# Patient Record
Sex: Female | Born: 1940 | Race: Black or African American | Hispanic: No | Smoking: Former smoker
Health system: Southern US, Community
[De-identification: ages and names within clinical notes are randomized; demographics above are authoritative.]

## PROBLEM LIST (undated history)

## (undated) ENCOUNTER — Ambulatory Visit (HOSPITAL_COMMUNITY): Admission: EM | Payer: 59 | Source: Home / Self Care

## (undated) DIAGNOSIS — N3281 Overactive bladder: Secondary | ICD-10-CM

## (undated) DIAGNOSIS — I10 Essential (primary) hypertension: Secondary | ICD-10-CM

## (undated) DIAGNOSIS — F419 Anxiety disorder, unspecified: Secondary | ICD-10-CM

## (undated) DIAGNOSIS — M8580 Other specified disorders of bone density and structure, unspecified site: Secondary | ICD-10-CM

## (undated) DIAGNOSIS — K219 Gastro-esophageal reflux disease without esophagitis: Principal | ICD-10-CM

## (undated) DIAGNOSIS — T7840XA Allergy, unspecified, initial encounter: Secondary | ICD-10-CM

## (undated) DIAGNOSIS — G14 Postpolio syndrome: Secondary | ICD-10-CM

## (undated) DIAGNOSIS — D509 Iron deficiency anemia, unspecified: Secondary | ICD-10-CM

## (undated) DIAGNOSIS — E785 Hyperlipidemia, unspecified: Secondary | ICD-10-CM

## (undated) HISTORY — DX: Overactive bladder: N32.81

## (undated) HISTORY — DX: Hyperlipidemia, unspecified: E78.5

## (undated) HISTORY — PX: ABDOMINAL HYSTERECTOMY: SHX81

## (undated) HISTORY — DX: Other specified disorders of bone density and structure, unspecified site: M85.80

## (undated) HISTORY — DX: Allergy, unspecified, initial encounter: T78.40XA

## (undated) HISTORY — DX: Anxiety disorder, unspecified: F41.9

## (undated) HISTORY — DX: Postpolio syndrome: G14

## (undated) HISTORY — PX: CARPAL TUNNEL RELEASE: SHX101

## (undated) HISTORY — DX: Iron deficiency anemia, unspecified: D50.9

## (undated) HISTORY — DX: Essential (primary) hypertension: I10

## (undated) HISTORY — DX: Gastro-esophageal reflux disease without esophagitis: K21.9

---

## 1998-10-22 ENCOUNTER — Ambulatory Visit (HOSPITAL_COMMUNITY): Admission: RE | Admit: 1998-10-22 | Discharge: 1998-10-22 | Payer: Self-pay

## 1999-02-05 ENCOUNTER — Other Ambulatory Visit: Admission: RE | Admit: 1999-02-05 | Discharge: 1999-02-05 | Payer: Self-pay | Admitting: *Deleted

## 1999-11-12 ENCOUNTER — Ambulatory Visit (HOSPITAL_COMMUNITY): Admission: RE | Admit: 1999-11-12 | Discharge: 1999-11-12 | Payer: Self-pay | Admitting: *Deleted

## 1999-11-20 ENCOUNTER — Encounter: Admission: RE | Admit: 1999-11-20 | Discharge: 1999-11-20 | Payer: Self-pay | Admitting: *Deleted

## 1999-11-22 ENCOUNTER — Encounter: Admission: RE | Admit: 1999-11-22 | Discharge: 1999-11-22 | Payer: Self-pay | Admitting: *Deleted

## 1999-12-26 ENCOUNTER — Encounter: Admission: RE | Admit: 1999-12-26 | Discharge: 1999-12-26 | Payer: Self-pay | Admitting: *Deleted

## 2000-02-25 ENCOUNTER — Other Ambulatory Visit: Admission: RE | Admit: 2000-02-25 | Discharge: 2000-02-25 | Payer: Self-pay | Admitting: *Deleted

## 2000-03-09 ENCOUNTER — Inpatient Hospital Stay (HOSPITAL_COMMUNITY): Admission: RE | Admit: 2000-03-09 | Discharge: 2000-03-11 | Payer: Self-pay | Admitting: *Deleted

## 2000-03-09 ENCOUNTER — Encounter (INDEPENDENT_AMBULATORY_CARE_PROVIDER_SITE_OTHER): Payer: Self-pay

## 2000-03-09 ENCOUNTER — Encounter (INDEPENDENT_AMBULATORY_CARE_PROVIDER_SITE_OTHER): Payer: Self-pay | Admitting: Specialist

## 2000-03-09 HISTORY — PX: TOTAL ABDOMINAL HYSTERECTOMY W/ BILATERAL SALPINGOOPHORECTOMY: SHX83

## 2000-12-02 ENCOUNTER — Ambulatory Visit (HOSPITAL_BASED_OUTPATIENT_CLINIC_OR_DEPARTMENT_OTHER): Admission: RE | Admit: 2000-12-02 | Discharge: 2000-12-02 | Payer: Self-pay | Admitting: *Deleted

## 2001-02-03 ENCOUNTER — Ambulatory Visit (HOSPITAL_COMMUNITY): Admission: RE | Admit: 2001-02-03 | Discharge: 2001-02-03 | Payer: Self-pay | Admitting: Internal Medicine

## 2001-02-03 ENCOUNTER — Encounter: Payer: Self-pay | Admitting: Internal Medicine

## 2001-03-18 ENCOUNTER — Other Ambulatory Visit: Admission: RE | Admit: 2001-03-18 | Discharge: 2001-03-18 | Payer: Self-pay | Admitting: Obstetrics and Gynecology

## 2002-09-09 ENCOUNTER — Other Ambulatory Visit: Admission: RE | Admit: 2002-09-09 | Discharge: 2002-09-09 | Payer: Self-pay | Admitting: Internal Medicine

## 2003-06-06 ENCOUNTER — Other Ambulatory Visit: Admission: RE | Admit: 2003-06-06 | Discharge: 2003-06-06 | Payer: Self-pay | Admitting: Obstetrics and Gynecology

## 2003-09-05 ENCOUNTER — Emergency Department (HOSPITAL_COMMUNITY): Admission: EM | Admit: 2003-09-05 | Discharge: 2003-09-05 | Payer: Self-pay

## 2008-01-31 ENCOUNTER — Ambulatory Visit: Payer: Self-pay | Admitting: Gastroenterology

## 2008-01-31 DIAGNOSIS — K625 Hemorrhage of anus and rectum: Secondary | ICD-10-CM | POA: Insufficient documentation

## 2008-02-03 ENCOUNTER — Ambulatory Visit: Payer: Self-pay | Admitting: Gastroenterology

## 2008-03-10 ENCOUNTER — Ambulatory Visit: Payer: Self-pay | Admitting: Gastroenterology

## 2008-06-03 ENCOUNTER — Emergency Department (HOSPITAL_COMMUNITY): Admission: EM | Admit: 2008-06-03 | Discharge: 2008-06-03 | Payer: Self-pay | Admitting: Family Medicine

## 2009-01-25 ENCOUNTER — Encounter: Admission: RE | Admit: 2009-01-25 | Discharge: 2009-01-25 | Payer: Self-pay | Admitting: Internal Medicine

## 2009-06-20 ENCOUNTER — Emergency Department (HOSPITAL_COMMUNITY): Admission: EM | Admit: 2009-06-20 | Discharge: 2009-06-20 | Payer: Self-pay | Admitting: Orthopaedic Surgery

## 2009-10-13 ENCOUNTER — Emergency Department (HOSPITAL_COMMUNITY): Admission: EM | Admit: 2009-10-13 | Discharge: 2009-10-13 | Payer: Self-pay | Admitting: Family Medicine

## 2011-05-13 NOTE — Assessment & Plan Note (Signed)
[facility]                         GASTROENTEROLOGY OFFICE NOTE   NAME:Barker, Katie EMANUELE                   MRN:          [id number]  DATE:01/31/2008                            DOB:          11-27-1941    REASON FOR CONSULTATION:  Rectal bleeding.   Katie Barker is a pleasant 70 year old white female referred through  the courtesy of Dr. [name] for evaluation.  For the last 2 years  she has noted intermittent rectal bleeding consisting of bright red  blood in the water or on the toilet tissue.  She has noticed some  protrusion of what sounds like hemorrhoids with her bowel movements.  She denies rectal or abdominal pain.  There has been no change in bowel  habits.   PAST MEDICAL HISTORY:  Pertinent for hypertension and polio.  She does  self-catheterize for her polio.   FAMILY HISTORY:  Noncontributory.   MEDICATIONS:  Include Benicar, Nexium, __________ , Lipitor, melatonin  and baby aspirin.   ALLERGIES:  SHE IS ALLERGIC TO PENICILLIN.   She neither smokes nor drinks.  She is married.  She is retired.   REVIEW OF SYSTEMS:  Pertinent for joint and back pain.   PHYSICAL EXAMINATION:  Pulse 84, blood pressure 146/86, weight 158.  HEENT:  EOMI.  PERRLA.  Sclerae are anicteric.  Conjunctivae are pink.  NECK:  Supple without thyromegaly, adenopathy or carotid bruits.  CHEST:  Clear to auscultation and percussion without adventitious  sounds.  CARDIAC:  Regular rhythm; normal S1 S2.  There are no murmurs, gallops  or rubs.  ABDOMEN:  Bowel sounds are normoactive.  Abdomen is soft, nontender and  nondistended.  There are no abdominal masses, tenderness, splenic  enlargement or hepatomegaly.  EXTREMITIES:  Full range of motion.  No cyanosis, clubbing or edema.  RECTAL:  Deferred.  (Limited rectal bleeding--most likely secondary to  hemorrhoids.  A more proximal colonic bleeding source out to be ruled  out.)   RECOMMENDATIONS:   Colonoscopy.     Katie Barker. Arlyce Dice, MD,FACG  Electronically Signed   RDK/MedQ  DD: 01/31/2008  DT: 02/01/2008  Job #: [id number]   cc:   Georgianne Fick, M.D.

## 2011-05-13 NOTE — Letter (Signed)
January 31, 2008    Joni Reining   RE:  DANYA, SPEARMAN  MRN:  [id number]  /  DOB:  09/15/1941   Dear Cordelia Pen:   It is my pleasure to have treated you recently as a new patient in my  office.  I appreciate your confidence and the opportunity to participate  in your care.   Since I do have a busy inpatient endoscopy schedule and office schedule,  my office hours vary weekly.  I am, however, available for emergency  calls every day through my office.  If I cannot promptly meet an urgent  office appointment, another one of our gastroenterologists will be able  to assist you.   My well-trained staff are prepared to help you at all times.  For  emergencies after office hours, a physician from our gastroenterology  section is always available through my 24-hour answering service.   While you are under my care, I encourage discussion of your questions  and concerns, and I will be happy to return your calls as soon as I am  available.   Once again, I welcome you as a new patient and I look forward to a happy  and healthy relationship.    Sincerely,      Barbette Hair. Arlyce Dice, MD,FACG  Electronically Signed   RDK/MedQ  DD: 01/31/2008  DT: 02/01/2008  Job #: [id number]

## 2011-05-13 NOTE — Assessment & Plan Note (Signed)
[facility]                         GASTROENTEROLOGY OFFICE NOTE   NAME:Boyar, RAYSHELL GOECKE                   MRN:          [id number]  DATE:03/10/2008                            DOB:          [birth year]    PROBLEM:  Rectal bleeding.   Mrs. Sedam has returned following colonoscopy.  On February 03, 2008,  colonoscopy demonstrated internal hemorrhoids only.  She was given  Anusol suppositories with improvement in her bleeding.  At this point,  she may have a scant amount of blood on the toilet tissue only.  She is  without pain.   EXAM:  Pulse 88, blood pressure 132/86, weight 159.   IMPRESSION:  Very limited rectal bleeding, due to internal hemorrhoids.   RECOMMENDATIONS:  Continue symptomatic therapy only.     Barbette Hair. Arlyce Dice, MD,FACG  Electronically Signed    RDK/MedQ  DD: 03/10/2008  DT: 03/10/2008  Job #: [id number]   cc:   Georgianne Fick, M.D.

## 2011-05-13 NOTE — Letter (Signed)
January 31, 2008    Georgianne Fick, M.D.  [address]  [place], [place] [address]   RE:  Katie, Barker  MRN:  [id number]  /  DOB:  Nov 29, 1941   Dear [name]:   It is my pleasure to have treated you recently as a new patient in my  office.  I appreciate your confidence and the opportunity to participate  in your care.   Since I do have a busy inpatient endoscopy schedule and office schedule,  my office hours vary weekly.  I am, however, available for emergency  calls every day through my office.  If I cannot promptly meet an urgent  office appointment, another one of our gastroenterologists will be able  to assist you.   My well-trained staff are prepared to help you at all times.  For  emergencies after office hours, a physician from our gastroenterology  section is always available through my 24-hour answering service.   While you are under my care, I encourage discussion of your questions  and concerns, and I will be happy to return your calls as soon as I am  available.   Once again, I welcome you as a new patient and I look forward to a happy  and healthy relationship.    Sincerely,      Barbette Hair. Arlyce Dice, MD,FACG  Electronically Signed    RDK/MedQ  DD: 01/31/2008  DT: 02/01/2008  Job #: [id number]

## 2011-05-16 NOTE — Op Note (Signed)
[place]  Patient:    Katie Barker, Katie Barker                   MRN: [id number] Proc. Date: 12/03/00 Adm. Date:  [id number] Attending:  Kendell Bane                           Operative Report  PREOPERATIVE DIAGNOSIS:  Foreign body, right thumb.  POSTOPERATIVE DIAGNOSIS:  Foreign body, right thumb.  OPERATION:  Removal of foreign body, right thumb.  SURGEON:  Lowell Bouton, M.D.  ANESTHESIA:  0.5% Marcaine mixed with 2% lidocaine local.  OPERATIVE FINDINGS:  The patient had a small fishbone in the subcutaneous tissue of the pulp of her right thumb.  DESCRIPTION OF PROCEDURE:  Under local anesthesia with a digital block in the right thumb, the right hand was prepped and draped as a ______ room case.  A Penrose was used as a tourniquet at the base of the thumb, and the scab on the tip of the thumb was removed.  By gently probing in subcutaneous tissue, the foreign body was removed.  No sutures were inserted.  A Band-Aid was applied. The patient was discharged in good condition. DD:  12/03/00 TD:  12/03/00 Job: [id number]

## 2012-09-07 ENCOUNTER — Encounter: Payer: Self-pay | Admitting: Internal Medicine

## 2012-09-07 ENCOUNTER — Ambulatory Visit (INDEPENDENT_AMBULATORY_CARE_PROVIDER_SITE_OTHER): Payer: PRIVATE HEALTH INSURANCE | Admitting: Internal Medicine

## 2012-09-07 VITALS — BP 148/81 | HR 98 | Temp 97.5°F | Ht 63.0 in | Wt 154.4 lb

## 2012-09-07 DIAGNOSIS — I10 Essential (primary) hypertension: Secondary | ICD-10-CM

## 2012-09-07 DIAGNOSIS — K219 Gastro-esophageal reflux disease without esophagitis: Secondary | ICD-10-CM

## 2012-09-07 DIAGNOSIS — F411 Generalized anxiety disorder: Secondary | ICD-10-CM

## 2012-09-07 DIAGNOSIS — B91 Sequelae of poliomyelitis: Secondary | ICD-10-CM

## 2012-09-07 DIAGNOSIS — K5900 Constipation, unspecified: Secondary | ICD-10-CM | POA: Insufficient documentation

## 2012-09-07 DIAGNOSIS — N3281 Overactive bladder: Secondary | ICD-10-CM

## 2012-09-07 DIAGNOSIS — N318 Other neuromuscular dysfunction of bladder: Secondary | ICD-10-CM

## 2012-09-07 DIAGNOSIS — E785 Hyperlipidemia, unspecified: Secondary | ICD-10-CM

## 2012-09-07 DIAGNOSIS — Z0000 Encounter for general adult medical examination without abnormal findings: Secondary | ICD-10-CM

## 2012-09-07 DIAGNOSIS — F419 Anxiety disorder, unspecified: Secondary | ICD-10-CM

## 2012-09-07 DIAGNOSIS — G14 Postpolio syndrome: Secondary | ICD-10-CM

## 2012-09-07 DIAGNOSIS — D509 Iron deficiency anemia, unspecified: Secondary | ICD-10-CM

## 2012-09-07 HISTORY — DX: Essential (primary) hypertension: I10

## 2012-09-07 HISTORY — DX: Overactive bladder: N32.81

## 2012-09-07 HISTORY — DX: Anxiety disorder, unspecified: F41.9

## 2012-09-07 HISTORY — DX: Gastro-esophageal reflux disease without esophagitis: K21.9

## 2012-09-07 HISTORY — DX: Hyperlipidemia, unspecified: E78.5

## 2012-09-07 HISTORY — DX: Iron deficiency anemia, unspecified: D50.9

## 2012-09-07 HISTORY — DX: Postpolio syndrome: G14

## 2012-09-07 MED ORDER — FERROUS SULFATE 325 (65 FE) MG PO TABS: 325.0000 mg | ORAL_TABLET | Freq: Every day | ORAL | Status: DC

## 2012-09-07 MED ORDER — DIAZEPAM 2 MG PO TABS: 2.0000 mg | ORAL_TABLET | ORAL | Status: DC | PRN

## 2012-09-07 MED ORDER — ESOMEPRAZOLE MAGNESIUM 40 MG PO CPDR: 40.0000 mg | DELAYED_RELEASE_CAPSULE | Freq: Every day | ORAL | Status: DC

## 2012-09-07 NOTE — Progress Notes (Signed)
  Subjective:    Patient ID: Katie Barker, female    DOB: [birth year], 71 y.o.   MRN: [id number]  HPI  Please see the A&P for the status of the pt's chronic medical problems.  This is my first visit with Katie Barker. She is transferring care from Dr [name]. Apparently she was dismissed for three no shows and a few resch appts but that is only inference from his records.  Review of Systems  Constitutional: Negative for activity change, appetite change and unexpected weight change.  HENT: Negative for sore throat, rhinorrhea and postnasal drip.   Eyes: Negative for pain and itching.  Respiratory: Negative for cough and shortness of breath.   Cardiovascular: Negative for chest pain and leg swelling.  Gastrointestinal: Positive for abdominal pain and constipation. Negative for nausea, vomiting and diarrhea.  Musculoskeletal: Positive for arthralgias and gait problem.       Muscular weakness. Instability on uneven surfaces. Unable to take stairs / steps.  Skin: Negative for rash.  Neurological: Positive for headaches. Negative for dizziness and light-headedness.  Psychiatric/Behavioral: Positive for disturbed wake/sleep cycle. The patient is nervous/anxious.        Objective:   Physical Exam  Constitutional: She is oriented to person, place, and time. She appears well-developed and well-nourished. No distress.  HENT:  Head: Normocephalic and atraumatic.  Right Ear: External ear normal.  Left Ear: External ear normal.  Nose: Nose normal.  Eyes: Conjunctivae and EOM are normal.  Neck: Normal range of motion. Neck supple. No thyromegaly present.  Cardiovascular: Normal rate, regular rhythm and normal heart sounds.   No murmur heard. Pulmonary/Chest: Effort normal and breath sounds normal.  Abdominal: Soft. Bowel sounds are normal. There is no tenderness.  Musculoskeletal: She exhibits no edema and no tenderness.  Lymphadenopathy:    She has no cervical adenopathy.    Neurological: She is alert and oriented to person, place, and time.  Skin: Skin is warm and dry. She is not diaphoretic.  Psychiatric: She has a normal mood and affect. Her behavior is normal. Judgment and thought content normal.          Assessment & Plan:

## 2012-09-07 NOTE — Assessment & Plan Note (Signed)
She has a log of her home BP's. All systolics are btw 100 and 120 and diastolics are < 80. Her BP is higher here and reviewing her old records, her office pressure is always elevated. She states this is normal for her. Leave med as is for now and follow BP's.   Had CMP 12/2011 and was nl.

## 2012-09-07 NOTE — Assessment & Plan Note (Signed)
She showed me a bottle of Valium 2mg  and requested a refill. She describes herself as a nervous person, had taken one prior to my visit, and used the rest for dentist visits, prior to procedures, etc. She had gotten 8 on 04/23/2012 and had just ran out today. So 8 pills in about 4.5 months or about 2 per month. This is minimal usage so I feel Ok refilling the med and gave her 24 - may not last until her 6 month F/U. I told her not to drive after taking a pill.

## 2012-09-07 NOTE — Patient Instructions (Signed)
See me in 6 months.  I will send you letter to [place] [employer] later this week and will send you a copy.  Call if you need anything before your next appt.

## 2012-09-07 NOTE — Assessment & Plan Note (Signed)
MS Tang did not list this as part of her PMHx and I only discovered looking through her old records. Labs from April 2013 include: HgB 9.7 MCV 64 RDW 17 (nl) Ferritin 7 Iron 14 % sat 14 TIBC 454 (high) Stool cards May 2013 negative x 3 Colonoscopy per pt report was nl 2-3 yrs ago and a 10 year repeat was requested. Records are not available in EPIC nor in her prior PCP's records.   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 She reportedly has had a nl recent colonoscopy and negative stool cards a few months ago so Gi loss unlikely. This could represent underproduction (would have to be RBC blood line only as WBC and PLTS are nl) or simply low iron stores.   Pt called and states that she was Rx'd iron pills to take QD. No side effects but ran out and wasn't able to get refill. Pt willing to take so will send Rx (to increase to TID if able) and recheck levels at F/U.

## 2012-09-07 NOTE — Assessment & Plan Note (Signed)
She had trouble with incontinence when anxious, excited, coughing, or laughing. She saw a urologist and was started on Toviaz and the issue resolved. She continues to take the med and see her urologist yearly.

## 2012-09-07 NOTE — Assessment & Plan Note (Signed)
Her polio mostly affected her R leg which is now shorter. She wears special shoes. 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 She used to see Dr Sandria Manly who told her there was nothing specific that he needed to do for her. She has weakness & numbness of her B LE. It makes it difficult to walk long distances or over uneven surfaces. She has an electric WC for those times. Otherwise, she uses a cane. She has had no falls bc she is rather careful. She is unable to take any stairs / steps bc her legs would give out on her. She requests a letter for the [employer] to get her mail delivered to her door.

## 2012-09-07 NOTE — Assessment & Plan Note (Signed)
She has had symptomatic GERD for about 5-6 months. She was started on Nexium and her sxs resolved. She has been out of her PPI and now has had two days of RUQ discomfort and bubbling noises. It does not get worse with movement of food. She has had this issue and the past and it required no intervention. She is sure it is just "gas" and wants to resume her PPI and will call me if that does not fix this issue. I have refilled her PPI for QD although she takes it PRN which is OK with me as her sxs are controlled with PRN usage.

## 2012-09-07 NOTE — Assessment & Plan Note (Signed)
Her 10 yr CV risk is <5%. She has 2 RF but gets to minus one 2/2 her high HDL so her LDL goal is <160. Her last LDL was 103 in Jan 2013 so she is at goal on her statin. Her LFT's were nl in Jan 2013. Cont statin at current dose.

## 2012-09-15 ENCOUNTER — Other Ambulatory Visit: Payer: Self-pay | Admitting: *Deleted

## 2012-09-15 MED ORDER — ATORVASTATIN CALCIUM 20 MG PO TABS: 20.0000 mg | ORAL_TABLET | Freq: Every day | ORAL | Status: DC

## 2012-09-15 NOTE — Telephone Encounter (Signed)
Pt stated Diazepam rx was never called to her pharmacy; I called in rx Diazepam 2mg  - 1 tab PRN qty#10; 0 refills called to [facility].

## 2012-09-15 NOTE — Telephone Encounter (Signed)
To see me about March 2014

## 2012-09-15 NOTE — Telephone Encounter (Signed)
Thank you for phoning in the diazepam

## 2012-10-20 ENCOUNTER — Encounter: Payer: Self-pay | Admitting: Internal Medicine

## 2012-10-26 ENCOUNTER — Other Ambulatory Visit: Payer: Self-pay | Admitting: *Deleted

## 2012-10-26 MED ORDER — OLMESARTAN-AMLODIPINE-HCTZ 20-5-12.5 MG PO TABS: 1.0000 | ORAL_TABLET | Freq: Every day | ORAL | Status: DC

## 2013-03-22 ENCOUNTER — Ambulatory Visit: Payer: PRIVATE HEALTH INSURANCE | Admitting: Internal Medicine

## 2013-03-29 ENCOUNTER — Encounter: Payer: Self-pay | Admitting: Internal Medicine

## 2013-05-03 ENCOUNTER — Ambulatory Visit: Payer: PRIVATE HEALTH INSURANCE | Admitting: Internal Medicine

## 2013-06-14 ENCOUNTER — Ambulatory Visit (INDEPENDENT_AMBULATORY_CARE_PROVIDER_SITE_OTHER): Payer: PRIVATE HEALTH INSURANCE | Admitting: Internal Medicine

## 2013-06-14 VITALS — BP 131/82 | HR 74 | Temp 98.4°F | Ht 63.0 in | Wt 153.9 lb

## 2013-06-14 DIAGNOSIS — Z0000 Encounter for general adult medical examination without abnormal findings: Secondary | ICD-10-CM

## 2013-06-14 DIAGNOSIS — F419 Anxiety disorder, unspecified: Secondary | ICD-10-CM

## 2013-06-14 DIAGNOSIS — B91 Sequelae of poliomyelitis: Secondary | ICD-10-CM

## 2013-06-14 DIAGNOSIS — N3281 Overactive bladder: Secondary | ICD-10-CM

## 2013-06-14 DIAGNOSIS — E785 Hyperlipidemia, unspecified: Secondary | ICD-10-CM

## 2013-06-14 DIAGNOSIS — I10 Essential (primary) hypertension: Secondary | ICD-10-CM

## 2013-06-14 DIAGNOSIS — D509 Iron deficiency anemia, unspecified: Secondary | ICD-10-CM

## 2013-06-14 DIAGNOSIS — N318 Other neuromuscular dysfunction of bladder: Secondary | ICD-10-CM

## 2013-06-14 DIAGNOSIS — F411 Generalized anxiety disorder: Secondary | ICD-10-CM

## 2013-06-14 DIAGNOSIS — G14 Postpolio syndrome: Secondary | ICD-10-CM

## 2013-06-14 LAB — CBC
HCT: 38.7 % (ref 36.0–46.0)
Platelets: 340 10*3/uL (ref 150–400)
RBC: 4.84 MIL/uL (ref 3.87–5.11)
RDW: 14.5 % (ref 11.5–15.5)
WBC: 5.6 10*3/uL (ref 4.0–10.5)

## 2013-06-14 LAB — LIPID PANEL
HDL: 65 mg/dL (ref >39)
LDL Cholesterol: 113 mg/dL — ABNORMAL HIGH (ref 0–99)
Total CHOL/HDL Ratio: 3 Ratio

## 2013-06-14 LAB — BASIC METABOLIC PANEL WITH GFR
BUN: 19 mg/dL (ref 6–23)
CO2: 24 mEq/L (ref 19–32)
Creat: 0.76 mg/dL (ref 0.50–1.10)
GFR, Est African American: >89 mL/min
Glucose, Bld: 99 mg/dL (ref 70–99)
Potassium: 4.1 mEq/L (ref 3.5–5.3)

## 2013-06-14 NOTE — Patient Instructions (Addendum)
General Instructions:  1. STOP the cholesterol medicine 2. I will send you a copy of your blood results 3. Tylenol is OK for your pain. 4. Edson Snowball will talk to you about cleaning help 5. I will check on the bone density test 6. Send me the form for a license plate   Treatment Goals:  Goals (1 Years of Data) as of 06/14/13         As of Today 09/07/12     Blood Pressure    . Blood Pressure < 140/90  131/82 148/81     Result Component    . LDL CALC < 160         Progress Toward Treatment Goals:  Treatment Goal 06/14/2013  Blood pressure at goal    Self Care Goals & Plans:  Self Care Goal 06/14/2013  Manage my medications take my medicines as prescribed; bring my medications to every visit; refill my medications on time  Monitor my health keep track of my blood pressure; bring my blood pressure log to each visit; keep track of my weight  Eat healthy foods eat baked foods instead of fried foods; drink diet soda or water instead of juice or soda; eat foods that are low in salt  Be physically active (No Data)       Care Management & Community Referrals:  Referral 06/14/2013  Referrals made for care management support social worker

## 2013-06-15 ENCOUNTER — Encounter: Payer: Self-pay | Admitting: Internal Medicine

## 2013-06-15 NOTE — Assessment & Plan Note (Signed)
Uses the Valium very infreq - MD appts, dentist appt. I will refill whenever needed.

## 2013-06-15 NOTE — Assessment & Plan Note (Signed)
BP Readings from Last 3 Encounters:  06/14/13 131/82  09/07/12 148/81    Lab Results  Component Value Date   NA 137 06/14/2013   K 4.1 06/14/2013   CREATININE 0.76 06/14/2013    Assessment: Blood pressure control: controlled Progress toward BP goal:  at goal Comments: At goal  Plan: Medications:  continue current medications Educational resources provided: brochure;handout Self management tools provided:  (PATIENT HAS BP LOG ALREADY) Other plans: Pt is at goal on her combo pill ARB, norvasc, HCTZ. No side effects.

## 2013-06-15 NOTE — Assessment & Plan Note (Addendum)
We spent some time discussing this. She wants to get off her statin or go to a different as she thinks the Lipitor is causing her HA. I had already calc her LGL goal and it is < 160. Today her LDL is 113. We discussed her options - stop and recheck next appt or change to another. She elected to stop and recheck to see if can maintain goal without statin. I reviewed with pt no DM, CVA, AMI.

## 2013-06-15 NOTE — Progress Notes (Signed)
  Subjective:    Patient ID: Katie Barker, female    DOB: Feb 10, 1941, 72 y.o.   MRN: [id number]  HPI  Please see the A&P for the status of the pt's chronic medical problems.  Review of Systems  Gastrointestinal: Positive for abdominal pain.  Musculoskeletal: Positive for back pain and gait problem.  Neurological: Positive for weakness and headaches.  Psychiatric/Behavioral: Positive for sleep disturbance.       Objective:   Physical Exam  Constitutional: She is oriented to person, place, and time. She appears well-developed and well-nourished. No distress.  HENT:  Head: Normocephalic and atraumatic.  Right Ear: External ear normal.  Left Ear: External ear normal.  Nose: Nose normal.  Eyes: Conjunctivae and EOM are normal. Right eye exhibits no discharge. Left eye exhibits no discharge. No scleral icterus.  Neck: Normal range of motion. Neck supple. No thyromegaly present.  Cardiovascular: Normal rate, regular rhythm and normal heart sounds.   Pulmonary/Chest: Effort normal and breath sounds normal.  Abdominal: Soft. Bowel sounds are normal.  Musculoskeletal: Normal range of motion. She exhibits no edema and no tenderness.  Lymphadenopathy:    She has no cervical adenopathy.  Neurological: She is alert and oriented to person, place, and time.  Skin: Skin is warm and dry. She is not diaphoretic.  Psychiatric: She has a normal mood and affect. Her behavior is normal. Judgment and thought content normal.          Assessment & Plan:

## 2013-06-15 NOTE — Assessment & Plan Note (Signed)
She has weakness in her lower legs. She has leg discrepancy and uses special shoes. She gets mail delivered to her door. She uses two canes in her home and a WC for long distances and has a WC accessible van. She states that getting on toilet is difficult as she has to just get over it and then just drop down as no muscle strength to slowly lower herself. She will purchase a seat elevate but asks that it that doesn't work, would I write a letter of necessity for chair hight / elevated toilet and I said I would.   I filled out temp placard for handicap parking. She will send me form for permanent.   She understands that since she is indep in ADL's, insurance will not pay for aide but wants to pay out of pocket for someone to clean. Wants advice on where to find someone. Will ask Shana.

## 2013-06-15 NOTE — Assessment & Plan Note (Signed)
Will recheck her ferritin and CBC today. Does not endorse fatigue.   HgB now 12.4 up from 9.7. Ferritin 19 up from 7. Cont FeSO4.

## 2013-06-15 NOTE — Assessment & Plan Note (Signed)
Interested in DEXA but states had previously. Will review and order if needed.  I gave info on zostavax and Tdap.

## 2013-06-21 ENCOUNTER — Telehealth: Payer: Self-pay | Admitting: Licensed Clinical Social Worker

## 2013-06-21 NOTE — Telephone Encounter (Signed)
Katie Barker was referred to CSW to obtain house cleaning agencies.  CSW placed call to Katie Barker and confirmed pt is looking only for assistance with house cleaning.  CSW referred Katie Barker to [employer] [facility] [place]. Discussed with pt would prefer listing obtain from [employer], but if listing is unavailable CSW could obtain agencies off internet search.  Pt aware and will contact CSW if needed.  CSW will sign off at this time.

## 2013-08-22 ENCOUNTER — Other Ambulatory Visit: Payer: Self-pay | Admitting: Internal Medicine

## 2013-10-07 ENCOUNTER — Telehealth: Payer: Self-pay | Admitting: *Deleted

## 2013-10-07 NOTE — Telephone Encounter (Signed)
Pt called about Rx from 08/2012 for nerves. Suggest to call pharmacy about Rx about expiration of certain drugs. Usually after a year - meds are disgarded. Stanton Kidney Maykayla Highley RN 10/07/13 10:40AM

## 2013-10-10 ENCOUNTER — Other Ambulatory Visit: Payer: Self-pay | Admitting: Internal Medicine

## 2013-10-18 ENCOUNTER — Encounter: Payer: PRIVATE HEALTH INSURANCE | Admitting: Internal Medicine

## 2013-10-25 ENCOUNTER — Encounter (INDEPENDENT_AMBULATORY_CARE_PROVIDER_SITE_OTHER): Payer: Self-pay

## 2013-10-25 ENCOUNTER — Ambulatory Visit (INDEPENDENT_AMBULATORY_CARE_PROVIDER_SITE_OTHER): Payer: PRIVATE HEALTH INSURANCE | Admitting: Internal Medicine

## 2013-10-25 ENCOUNTER — Encounter: Payer: Self-pay | Admitting: Internal Medicine

## 2013-10-25 VITALS — BP 142/89 | HR 84 | Temp 98.1°F | Wt 154.7 lb

## 2013-10-25 DIAGNOSIS — F411 Generalized anxiety disorder: Secondary | ICD-10-CM

## 2013-10-25 DIAGNOSIS — K219 Gastro-esophageal reflux disease without esophagitis: Secondary | ICD-10-CM

## 2013-10-25 DIAGNOSIS — D509 Iron deficiency anemia, unspecified: Secondary | ICD-10-CM

## 2013-10-25 DIAGNOSIS — E785 Hyperlipidemia, unspecified: Secondary | ICD-10-CM

## 2013-10-25 DIAGNOSIS — F419 Anxiety disorder, unspecified: Secondary | ICD-10-CM

## 2013-10-25 DIAGNOSIS — B91 Sequelae of poliomyelitis: Secondary | ICD-10-CM

## 2013-10-25 DIAGNOSIS — Z0000 Encounter for general adult medical examination without abnormal findings: Secondary | ICD-10-CM

## 2013-10-25 DIAGNOSIS — I10 Essential (primary) hypertension: Secondary | ICD-10-CM

## 2013-10-25 DIAGNOSIS — G14 Postpolio syndrome: Secondary | ICD-10-CM

## 2013-10-25 LAB — LIPID PANEL
Cholesterol: 270 mg/dL — ABNORMAL HIGH (ref 0–200)
LDL Cholesterol: 168 mg/dL — ABNORMAL HIGH (ref 0–99)
Triglycerides: 128 mg/dL (ref <150)
VLDL: 26 mg/dL (ref 0–40)

## 2013-10-25 MED ORDER — DIAZEPAM 2 MG PO TABS: 2.0000 mg | ORAL_TABLET | ORAL | Status: AC | PRN

## 2013-10-25 MED ORDER — FERROUS SULFATE 325 (65 FE) MG PO TABS: ORAL_TABLET | ORAL | Status: DC

## 2013-10-25 NOTE — Progress Notes (Signed)
  Subjective:    Patient ID: Katie Barker, female    DOB: 1941-11-24, 72 y.o.   MRN: [id number]  HPI  Please see the A&P for the status of the pt's chronic medical problems.   Review of Systems  Constitutional: Positive for fatigue. Negative for unexpected weight change.  HENT: Negative for rhinorrhea and sneezing.   Eyes: Negative for itching.  Respiratory: Negative for shortness of breath.   Cardiovascular: Positive for leg swelling. Negative for chest pain.  Gastrointestinal: Negative for nausea, vomiting and diarrhea.  Genitourinary: Negative for difficulty urinating.       Self intermittent cath  Musculoskeletal: Positive for arthralgias and back pain.  Neurological: Negative for dizziness and headaches.  Psychiatric/Behavioral: Positive for sleep disturbance.       Objective:   Physical Exam  Constitutional: She is oriented to person, place, and time. She appears well-developed and well-nourished. No distress.  HENT:  Head: Normocephalic and atraumatic.  Right Ear: External ear normal.  Left Ear: External ear normal.  Nose: Nose normal.  Eyes: Conjunctivae are normal. Right eye exhibits no discharge. Left eye exhibits no discharge. No scleral icterus.  Neck: Normal range of motion. Neck supple.  Cardiovascular: Normal rate, regular rhythm and normal heart sounds.   Pulmonary/Chest: Effort normal and breath sounds normal.  Musculoskeletal: She exhibits edema.  Trace lower ext edema  Neurological: She is alert and oriented to person, place, and time.  Skin: Skin is warm and dry. She is not diaphoretic.  Psychiatric: She has a normal mood and affect. Her behavior is normal. Judgment and thought content normal.          Assessment & Plan:

## 2013-10-25 NOTE — Assessment & Plan Note (Signed)
She self intermittent caths herself.

## 2013-10-25 NOTE — Assessment & Plan Note (Signed)
The 10 valium that I gave her last year still has 2 in her bottle. I refilled today again for 10. She uses this before MD appts, dentist appts, etc.

## 2013-10-25 NOTE — Assessment & Plan Note (Signed)
She brought her BP log and all were well controlled. She cont her Tribenzor.

## 2013-10-25 NOTE — Assessment & Plan Note (Signed)
Her CBC and ferritin were improving. No need to recheck.

## 2013-10-25 NOTE — Assessment & Plan Note (Signed)
She again refuses flu and pneumonia vaccine. We discussed DEXA today and she wants to think about it. Her only RF is low calcium intake. Otherwise, no fam hx, no height loss, no smoking, no meds, no tobacco.

## 2013-10-25 NOTE — Assessment & Plan Note (Signed)
She cont to take her PPI.

## 2013-10-25 NOTE — Assessment & Plan Note (Signed)
Her HA resolved once she stopped the lipitor. We will check her FLP today to see that her LDL is not increasing. Her LDL goal is < 160.

## 2013-10-25 NOTE — Patient Instructions (Signed)
1. See me in 6 - 12 months 2. Call me if you need any refills 3. I sent in your iron to [facility]

## 2013-10-26 ENCOUNTER — Encounter: Payer: Self-pay | Admitting: Internal Medicine

## 2013-10-28 ENCOUNTER — Telehealth: Payer: Self-pay | Admitting: *Deleted

## 2013-10-28 DIAGNOSIS — E785 Hyperlipidemia, unspecified: Secondary | ICD-10-CM

## 2013-10-28 NOTE — Telephone Encounter (Signed)
Pt calls and states she got your letter and yes, she would like to start on a new cholesterol medicine, just make sure it is not lipitor She also stated her diazepam had not been called in, i called it to [facility] [place]

## 2013-10-29 MED ORDER — PRAVASTATIN SODIUM 40 MG PO TABS: 40.0000 mg | ORAL_TABLET | Freq: Every day | ORAL | Status: DC

## 2013-10-29 NOTE — Telephone Encounter (Signed)
I sent in Prava. Please ask that she come for lab visit in Jan - order placed but lab visit not sch.

## 2013-11-03 NOTE — Telephone Encounter (Signed)
Please schedule lab appt in jan 2015

## 2013-12-05 ENCOUNTER — Telehealth: Payer: Self-pay | Admitting: Licensed Clinical Social Worker

## 2013-12-05 NOTE — Telephone Encounter (Signed)
I called the patient to remind her to schedule a mammogram, her phone number was not accepting calls. I am working off a Horticulturist, commercial for [employer] remind patients with [facility] insurance.

## 2014-01-03 ENCOUNTER — Other Ambulatory Visit: Payer: PRIVATE HEALTH INSURANCE

## 2014-02-07 ENCOUNTER — Encounter: Payer: Self-pay | Admitting: Internal Medicine

## 2014-02-07 ENCOUNTER — Ambulatory Visit (INDEPENDENT_AMBULATORY_CARE_PROVIDER_SITE_OTHER): Payer: PRIVATE HEALTH INSURANCE | Admitting: Internal Medicine

## 2014-02-07 VITALS — BP 134/91 | HR 90 | Temp 97.8°F | Ht 63.0 in | Wt 155.0 lb

## 2014-02-07 DIAGNOSIS — Z0000 Encounter for general adult medical examination without abnormal findings: Secondary | ICD-10-CM

## 2014-02-07 DIAGNOSIS — D509 Iron deficiency anemia, unspecified: Secondary | ICD-10-CM

## 2014-02-07 DIAGNOSIS — I10 Essential (primary) hypertension: Secondary | ICD-10-CM

## 2014-02-07 DIAGNOSIS — Z13820 Encounter for screening for osteoporosis: Secondary | ICD-10-CM

## 2014-02-07 DIAGNOSIS — K5900 Constipation, unspecified: Secondary | ICD-10-CM

## 2014-02-07 DIAGNOSIS — E785 Hyperlipidemia, unspecified: Secondary | ICD-10-CM

## 2014-02-07 DIAGNOSIS — K219 Gastro-esophageal reflux disease without esophagitis: Secondary | ICD-10-CM

## 2014-02-07 LAB — LIPID PANEL
Cholesterol: 239 mg/dL — ABNORMAL HIGH (ref 0–200)
HDL: 69 mg/dL (ref >39)
LDL Cholesterol: 150 mg/dL — ABNORMAL HIGH (ref 0–99)
TRIGLYCERIDES: 102 mg/dL (ref <150)
Total CHOL/HDL Ratio: 3.5 Ratio
VLDL: 20 mg/dL (ref 0–40)

## 2014-02-07 LAB — FERRITIN: Ferritin: 15 ng/mL (ref 10–291)

## 2014-02-07 NOTE — Assessment & Plan Note (Signed)
She takes FeSO4 QD. Will repeat ferritin. Has had colon 09 that was OK.

## 2014-02-07 NOTE — Assessment & Plan Note (Signed)
BP Readings from Last 3 Encounters:  02/07/14 134/91  10/25/13 142/89  06/14/13 131/82   Her Bp is great! No need to change. We reviewed her goal. She checks her BP at home and is better at home. Cont tribenzor.   Lab Results  Component Value Date   NA 137 06/14/2013   K 4.1 06/14/2013   CREATININE 0.76 06/14/2013    Assessment: Blood pressure control: controlled Progress toward BP goal:  at goal Comments: See above  Plan: Medications:  continue current medications Educational resources provided:   Self management tools provided:   Other plans: see above

## 2014-02-07 NOTE — Assessment & Plan Note (Signed)
She did not tolerate Lipitor 2/2 HA and prava to itching. Stop statins. Encouraged diet. Exercise limited by post polio. Check FLP today. LDL goal is < 160.

## 2014-02-07 NOTE — Patient Instructions (Signed)
I will mail your results to you. See me in 6 months

## 2014-02-07 NOTE — Progress Notes (Signed)
   Subjective:    Patient ID: Katie ReiningEthel Lee Gibby, female    DOB: 12-02-1941, 73 y.o.   MRN: [id number]  HPI  Please see the A&P for the status of the pt's chronic medical problems.   Review of Systems  Constitutional: Negative for activity change, appetite change and unexpected weight change.  HENT: Negative for rhinorrhea.   Eyes: Negative for itching.  Respiratory: Negative for shortness of breath.   Cardiovascular: Negative for chest pain.  Gastrointestinal: Positive for constipation. Negative for abdominal pain.  Genitourinary: Negative for difficulty urinating.  Musculoskeletal: Positive for back pain and gait problem.  Neurological: Positive for headaches.  Psychiatric/Behavioral: Negative for sleep disturbance.       Objective:   Physical Exam  Constitutional: She is oriented to person, place, and time. She appears well-developed and well-nourished. No distress.  HENT:  Head: Normocephalic and atraumatic.  Right Ear: External ear normal.  Left Ear: External ear normal.  Nose: Nose normal.  Eyes: Conjunctivae and EOM are normal.  Cardiovascular: Normal rate, regular rhythm and normal heart sounds.   Pulmonary/Chest: Effort normal and breath sounds normal.  Musculoskeletal: She exhibits no edema and no tenderness.  Neurological: She is alert and oriented to person, place, and time.  Skin: Skin is warm and dry.  Psychiatric: She has a normal mood and affect. Her behavior is normal. Judgment and thought content normal.          Assessment & Plan:

## 2014-02-07 NOTE — Assessment & Plan Note (Signed)
She uses a Mag laxative about q 3-4 days and only has BM when takes it. Her mother was this way. Colon Ok 2009.

## 2014-02-07 NOTE — Assessment & Plan Note (Signed)
DEXA scan ordered.  Refuses all vaccinations.

## 2014-02-07 NOTE — Assessment & Plan Note (Signed)
Still uses her PPI PRN.

## 2014-02-08 ENCOUNTER — Encounter: Payer: Self-pay | Admitting: Internal Medicine

## 2014-02-14 ENCOUNTER — Ambulatory Visit (HOSPITAL_COMMUNITY): Payer: PRIVATE HEALTH INSURANCE

## 2014-03-14 ENCOUNTER — Ambulatory Visit (INDEPENDENT_AMBULATORY_CARE_PROVIDER_SITE_OTHER): Payer: PRIVATE HEALTH INSURANCE | Admitting: Internal Medicine

## 2014-03-14 ENCOUNTER — Encounter: Payer: Self-pay | Admitting: Internal Medicine

## 2014-03-14 VITALS — BP 130/90 | HR 95 | Temp 97.9°F | Wt 155.3 lb

## 2014-03-14 DIAGNOSIS — D509 Iron deficiency anemia, unspecified: Secondary | ICD-10-CM

## 2014-03-14 DIAGNOSIS — Z0000 Encounter for general adult medical examination without abnormal findings: Secondary | ICD-10-CM

## 2014-03-14 NOTE — Progress Notes (Signed)
   Subjective:    Patient ID: Katie Barker, female    DOB: 1941/07/23, 73 y.o.   MRN: [id number]  HPI  2 weeks of rash on L lateral lower leg that is itchy. Used 1 % hydrocortisone and stopped all PO meds and getting better. Started back BP med today. Rash no where else. Only new med is increased iron from QD to TID 2/2 ferritin of 15. No sxs of iron def - HA, fatigue, RLS. Had colon 2009 that was nl. No sxs of blood loss - dark stools, BRBPR. Had dental procedure Jan and got IV sedation but rash started March.   Review of Systems See HPI    Objective:   Physical Exam  Constitutional: She appears well-developed and well-nourished. No distress.  HENT:  Head: Normocephalic and atraumatic.  Right Ear: External ear normal.  Left Ear: External ear normal.  Nose: Nose normal.  Skin: Skin is warm and dry. She is not diaphoretic.  L lateral calf - several slightly erythematous 3 mm - 4 mm areas. No bullous.           Assessment & Plan:

## 2014-03-14 NOTE — Assessment & Plan Note (Signed)
Culprit for rash does seem to be FESO4. Stopped it. To add back QD dosing once rash gone. Since symptomatic in terms of Fe def sxs, will not be aggressive.

## 2014-03-14 NOTE — Assessment & Plan Note (Signed)
Rash - likely 2/2 FESO$ TID. To cont hydrocortisone 1 % PRN. Add back meds except FESO4 and add that as QD only.

## 2014-03-14 NOTE — Patient Instructions (Signed)
Add back all meds except the iron,.  Once back on all meds and rash gone, add iron pill once daily  Try high iron foods.

## 2014-03-17 ENCOUNTER — Encounter: Payer: Self-pay | Admitting: Internal Medicine

## 2014-03-17 ENCOUNTER — Ambulatory Visit (HOSPITAL_COMMUNITY)
Admission: RE | Admit: 2014-03-17 | Discharge: 2014-03-17 | Disposition: A | Discharge status: Home / Self Care | Payer: PRIVATE HEALTH INSURANCE | Source: Ambulatory Visit | Attending: Internal Medicine | Admitting: Internal Medicine

## 2014-03-17 ENCOUNTER — Other Ambulatory Visit: Payer: Self-pay | Admitting: Internal Medicine

## 2014-03-17 DIAGNOSIS — Z13820 Encounter for screening for osteoporosis: Secondary | ICD-10-CM

## 2014-03-17 DIAGNOSIS — Z780 Asymptomatic menopausal state: Secondary | ICD-10-CM | POA: Insufficient documentation

## 2014-03-17 DIAGNOSIS — M8580 Other specified disorders of bone density and structure, unspecified site: Secondary | ICD-10-CM

## 2014-03-17 HISTORY — DX: Other specified disorders of bone density and structure, unspecified site: M85.80

## 2014-03-23 ENCOUNTER — Other Ambulatory Visit: Payer: Self-pay | Admitting: *Deleted

## 2014-03-23 MED ORDER — ESOMEPRAZOLE MAGNESIUM 40 MG PO CPDR: DELAYED_RELEASE_CAPSULE | ORAL | Status: DC

## 2014-07-05 ENCOUNTER — Telehealth: Payer: Self-pay | Admitting: Licensed Clinical Social Worker

## 2014-07-05 NOTE — Telephone Encounter (Signed)
Ms. Katie Barker placed call to CSW and left message requesting [facility] services.  CSW returned call to Ms. Katie Barker.  Pt states her daughter has recently moved out of town and pt now in need of PCS.  Ms. Katie Barker states she has had PCS in the past with [facility].  CSW informed Ms. Katie Barker it has been over 90 days [facility].  Pt will need to schedule [facility] with [facility]  Pt aware and states she will contact office for scheduling.  Pt aware CSW is available to assist as needed.

## 2014-07-13 ENCOUNTER — Ambulatory Visit (INDEPENDENT_AMBULATORY_CARE_PROVIDER_SITE_OTHER): Payer: PRIVATE HEALTH INSURANCE | Admitting: Internal Medicine

## 2014-07-13 ENCOUNTER — Encounter: Payer: Self-pay | Admitting: Internal Medicine

## 2014-07-13 VITALS — BP 123/85 | HR 80 | Temp 99.3°F | Wt 153.6 lb

## 2014-07-13 DIAGNOSIS — F411 Generalized anxiety disorder: Secondary | ICD-10-CM

## 2014-07-13 DIAGNOSIS — E785 Hyperlipidemia, unspecified: Secondary | ICD-10-CM

## 2014-07-13 DIAGNOSIS — G14 Postpolio syndrome: Secondary | ICD-10-CM

## 2014-07-13 DIAGNOSIS — I10 Essential (primary) hypertension: Secondary | ICD-10-CM

## 2014-07-13 DIAGNOSIS — D509 Iron deficiency anemia, unspecified: Secondary | ICD-10-CM

## 2014-07-13 DIAGNOSIS — B91 Sequelae of poliomyelitis: Secondary | ICD-10-CM

## 2014-07-13 DIAGNOSIS — F419 Anxiety disorder, unspecified: Secondary | ICD-10-CM

## 2014-07-13 LAB — LIPID PANEL
CHOL/HDL RATIO: 3.1 ratio
Cholesterol: 250 mg/dL — ABNORMAL HIGH (ref 0–200)
HDL: 80 mg/dL (ref >39)
LDL Cholesterol: 156 mg/dL — ABNORMAL HIGH (ref 0–99)
TRIGLYCERIDES: 72 mg/dL (ref <150)
VLDL: 14 mg/dL (ref 0–40)

## 2014-07-13 LAB — FERRITIN: Ferritin: 6 ng/mL — ABNORMAL LOW (ref 10–291)

## 2014-07-13 MED ORDER — TRAMADOL HCL 50 MG PO TABS: 50.0000 mg | ORAL_TABLET | Freq: Four times a day (QID) | ORAL | Status: DC | PRN

## 2014-07-13 NOTE — Progress Notes (Signed)
   Subjective:    Patient ID: Katie ReiningEthel Lee Barker, female    DOB: [birth year], 73 y.o.   MRN: [id number]  HPI  [name] is here to discuss PCS. Please see the A&P for the status of the pt's chronic medical problems.   Review of Systems  Musculoskeletal: Positive for back pain and gait problem.  Neurological: Positive for headaches.       Objective:   Physical Exam  Constitutional: She is oriented to person, place, and time. She appears well-developed and well-nourished. No distress.  HENT:  Head: Normocephalic and atraumatic.  Right Ear: External ear normal.  Left Ear: External ear normal.  Eyes: Conjunctivae and EOM are normal.  Pulmonary/Chest: No respiratory distress.  Neurological: She is alert and oriented to person, place, and time.  Skin: Skin is warm and dry. She is not diaphoretic.  Psychiatric: She has a normal mood and affect. Her behavior is normal. Judgment and thought content normal.          Assessment & Plan:

## 2014-07-13 NOTE — Assessment & Plan Note (Signed)
Had PCS in past but then had wo daughters nearby to assist so cancelled the services. Now daughter moving away and needs regular care. Needs assistance with bathing (has chair but needs help to get into the tub and washing hair), dressing (can't get arms over head, doing buttons), and cooking (can't stand for long periods of time and unsteady on feet).   Planng trip to [place] soon and requires 7 hours travel. Gets pain in back when sits long periods of time. NSAIDs and tylenol do not help. Her Valium doesn't help for muscle pain. I filled Tramadol #8 to get there and back.

## 2014-07-13 NOTE — Assessment & Plan Note (Signed)
Uses Valium for doctor's appt. Has bottle but afraid it is too old. Advised her to look for expiration date and call me if needs new Rx.

## 2014-07-13 NOTE — Assessment & Plan Note (Signed)
Taking iron pills once daily and tolerating well. Will check ferritin today.

## 2014-07-13 NOTE — Patient Instructions (Signed)
1. Let me know about the Valium 2. I sent in Tramadol for pain from sitting too long. May may you tired and unsteady on your feet 3. Enjoy your trip! 4. See me in 6 months 5. I will complete your aide paperwork an the state will call you

## 2014-07-13 NOTE — Assessment & Plan Note (Signed)
She stop statin 2/2 intolerance. Requested FLP today to see where he numbers are. Last LDL 150 5 months ago. Will check today as willing to consider a different satin.

## 2014-07-13 NOTE — Assessment & Plan Note (Signed)
BP is great on her Tribenzor. Cont med.

## 2014-07-14 ENCOUNTER — Encounter: Payer: Self-pay | Admitting: Internal Medicine

## 2014-07-14 ENCOUNTER — Other Ambulatory Visit: Payer: Self-pay | Admitting: *Deleted

## 2014-07-14 MED ORDER — FERROUS SULFATE 325 (65 FE) MG PO TABS: ORAL_TABLET | ORAL | Status: DC

## 2014-07-14 NOTE — Telephone Encounter (Signed)
Is this the correct amount of iron for this patient.?

## 2014-07-14 NOTE — Telephone Encounter (Signed)
Yes. Correct dose and sig. I sent in refill

## 2014-09-05 ENCOUNTER — Telehealth: Payer: Self-pay | Admitting: *Deleted

## 2014-09-05 NOTE — Telephone Encounter (Signed)
Not a common rxn but OK to stop for now. Ask her to eat iron rich foods. Will also ask Dr Selena Batten if there is an alternative oral iron prep less likely to cause side effects since her ferritin was low.

## 2014-09-05 NOTE — Telephone Encounter (Signed)
Pt called stating she was started on Ferrous Sulfate on  7/17.   After taking for a few weeks pt started having itching to entire body.  She was told to increase med to BID and when she increased dose she was not able to sleep at night and had Chest Tightness, stating she felt her chest would burst open.  This was continuous but she did not note any diaphoresis , nausea or SOB.  She also did not call the office.  Her daughter told her to stop the Iron, pt did and after 3 days all symptoms went away.     Pt # [id number]

## 2014-09-06 NOTE — Telephone Encounter (Signed)
Other options include ferrous fumarate or ferrous gluconate. There is also a liquid formulation (OTC but pharmacies usually need to special order) ferrous sulfate she could try in case she is having a reaction to a tablet excipient. Hope this helps.

## 2014-09-06 NOTE — Telephone Encounter (Signed)
Pt called and informed. I reviewed a list of Iron Rich Foods, many that she likes.

## 2014-09-07 ENCOUNTER — Other Ambulatory Visit: Payer: Self-pay | Admitting: Internal Medicine

## 2015-01-17 DIAGNOSIS — R339 Retention of urine, unspecified: Secondary | ICD-10-CM | POA: Diagnosis not present

## 2015-01-29 ENCOUNTER — Telehealth: Payer: Self-pay | Admitting: *Deleted

## 2015-01-29 NOTE — Telephone Encounter (Signed)
[place] Do you know what I need to order? [facility] Tues PM so will check in with you.

## 2015-01-29 NOTE — Telephone Encounter (Signed)
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

## 2015-01-30 NOTE — Telephone Encounter (Signed)
I left it on your chair

## 2015-02-08 ENCOUNTER — Encounter: Payer: PRIVATE HEALTH INSURANCE | Admitting: Internal Medicine

## 2015-03-08 ENCOUNTER — Encounter: Payer: Self-pay | Admitting: Internal Medicine

## 2015-03-08 ENCOUNTER — Ambulatory Visit (INDEPENDENT_AMBULATORY_CARE_PROVIDER_SITE_OTHER): Payer: Medicare Other | Admitting: Internal Medicine

## 2015-03-08 VITALS — BP 114/72 | HR 87 | Temp 98.4°F | Resp 20 | Ht 62.0 in | Wt 152.2 lb

## 2015-03-08 DIAGNOSIS — D509 Iron deficiency anemia, unspecified: Secondary | ICD-10-CM

## 2015-03-08 DIAGNOSIS — K219 Gastro-esophageal reflux disease without esophagitis: Secondary | ICD-10-CM

## 2015-03-08 DIAGNOSIS — I10 Essential (primary) hypertension: Secondary | ICD-10-CM | POA: Diagnosis not present

## 2015-03-08 DIAGNOSIS — D539 Nutritional anemia, unspecified: Secondary | ICD-10-CM | POA: Diagnosis not present

## 2015-03-08 DIAGNOSIS — E785 Hyperlipidemia, unspecified: Secondary | ICD-10-CM | POA: Diagnosis not present

## 2015-03-08 DIAGNOSIS — Z0000 Encounter for general adult medical examination without abnormal findings: Secondary | ICD-10-CM

## 2015-03-08 DIAGNOSIS — R059 Cough, unspecified: Secondary | ICD-10-CM

## 2015-03-08 DIAGNOSIS — Z79899 Other long term (current) drug therapy: Secondary | ICD-10-CM | POA: Diagnosis not present

## 2015-03-08 DIAGNOSIS — R05 Cough: Secondary | ICD-10-CM

## 2015-03-08 LAB — BASIC METABOLIC PANEL WITH GFR
BUN: 15 mg/dL (ref 6–23)
CO2: 25 mEq/L (ref 19–32)
Calcium: 9.2 mg/dL (ref 8.4–10.5)
Chloride: 99 mEq/L (ref 96–112)
Creat: 0.71 mg/dL (ref 0.50–1.10)
GFR, EST NON AFRICAN AMERICAN: 85 mL/min
GFR, Est African American: >89 mL/min
GLUCOSE: 86 mg/dL (ref 70–99)
Potassium: 4.1 mEq/L (ref 3.5–5.3)
Sodium: 134 mEq/L — ABNORMAL LOW (ref 135–145)

## 2015-03-08 LAB — CBC
HCT: 32.2 % — ABNORMAL LOW (ref 36.0–46.0)
Hemoglobin: 9.9 g/dL — ABNORMAL LOW (ref 12.0–15.0)
MCH: 21.3 pg — ABNORMAL LOW (ref 26.0–34.0)
MCHC: 30.7 g/dL (ref 30.0–36.0)
MCV: 69.2 fL — ABNORMAL LOW (ref 78.0–100.0)
MPV: 8.5 fL — ABNORMAL LOW (ref 8.6–12.4)
Platelets: 392 10*3/uL (ref 150–400)
RBC: 4.65 MIL/uL (ref 3.87–5.11)
RDW: 16 % — ABNORMAL HIGH (ref 11.5–15.5)
WBC: 7.3 10*3/uL (ref 4.0–10.5)

## 2015-03-08 LAB — LIPID PANEL
CHOL/HDL RATIO: 3 ratio
Cholesterol: 231 mg/dL — ABNORMAL HIGH (ref 0–200)
HDL: 76 mg/dL (ref >=46)
LDL Cholesterol: 140 mg/dL — ABNORMAL HIGH (ref 0–99)
Triglycerides: 73 mg/dL (ref <150)
VLDL: 15 mg/dL (ref 0–40)

## 2015-03-08 MED ORDER — RANITIDINE HCL 150 MG PO TABS: 150.0000 mg | ORAL_TABLET | Freq: Two times a day (BID) | ORAL | Status: DC

## 2015-03-08 MED ORDER — HYDROCOD POLST-CHLORPHEN POLST 10-8 MG/5ML PO LQCR: 5.0000 mL | Freq: Two times a day (BID) | ORAL | Status: DC | PRN

## 2015-03-08 NOTE — Patient Instructions (Signed)
Try the new stomach pill. Stop the nexium Try the cough med for your cough

## 2015-03-09 ENCOUNTER — Encounter: Payer: Self-pay | Admitting: Internal Medicine

## 2015-03-09 LAB — ANEMIA PANEL
%SAT: 5 % — ABNORMAL LOW (ref 20–55)
ABS Retic: 41.9 10*3/uL (ref 19.0–186.0)
Ferritin: 13 ng/mL (ref 10–291)
Folate: >20 ng/mL
IRON: 18 ug/dL — AB (ref 42–145)
RBC.: 4.65 MIL/uL (ref 3.87–5.11)
Retic Ct Pct: 0.9 % (ref 0.4–2.3)
TIBC: 391 ug/dL (ref 250–470)
UIBC: 373 ug/dL (ref 125–400)
Vitamin B-12: 473 pg/mL (ref 211–911)

## 2015-03-09 MED ORDER — FERROUS SULFATE 300 (60 FE) MG/5ML PO SYRP: 300.0000 mg | ORAL_SOLUTION | Freq: Every day | ORAL | Status: DC

## 2015-03-09 NOTE — Assessment & Plan Note (Signed)
BP Readings from Last 3 Encounters:  03/08/15 114/72  07/13/14 123/85  03/14/14 130/90   BP is great. Cont Tribenzor. BMP today.

## 2015-03-09 NOTE — Assessment & Plan Note (Signed)
WC assessment next appt.  Somehow we ended up talking about EMR and that if she were not able to tell her name, DOB, address that a new chart would be created and doctors would not know her PCN allergy and blood product refusal. We discussed getting a med alert bracelet.

## 2015-03-09 NOTE — Assessment & Plan Note (Signed)
She has not been interested in statin in past. Changed diet and LDL decreased from 156 to 161140. She is at goal and will not rec a statin at this time. Will send results via letter.

## 2015-03-09 NOTE — Assessment & Plan Note (Signed)
She is on PPI QD. We discussed recently reported concerns over long term use of PPI. She is on PPI just for GERD sxs. No PUD, ulcer, bleed hx. She is willing to change to H2B. Explained sxs might initially increase.

## 2015-03-09 NOTE — Assessment & Plan Note (Signed)
She has iron deficiency anemia and was unable to tolerate iron sulfate pills. I will check an anemia panel today as had only ferritin in past.  RESULTS Ferritin up to 13 from 6 % sat 5 Iron 18 HgB down to 9.9 from 12  She is not interested in IV iron at this time. Willing to try another iron PO form. Will change to iron sulfate liquid. 60 elemental iron daily. Will provide written instructions via letter.

## 2015-03-09 NOTE — Progress Notes (Signed)
   Subjective:    Patient ID: Katie Barker, female    DOB: 1941/11/09, 74 y.o.   MRN: [id number]  HPI  Please see the A&P for the status of the pt's chronic medical problems.  Review of Systems  Constitutional: Positive for appetite change and fatigue.  HENT: Positive for sneezing.   Eyes: Negative for discharge and itching.  Respiratory: Positive for cough. Negative for shortness of breath.   Gastrointestinal: Negative for nausea, abdominal pain and diarrhea.  Musculoskeletal: Negative for myalgias.       Objective:   Physical Exam  Constitutional: She is oriented to person, place, and time. She appears well-developed and well-nourished. No distress.  HENT:  Head: Normocephalic and atraumatic.  Right Ear: External ear normal.  Left Ear: External ear normal.  Nose: Nose normal.  Eyes: Conjunctivae and EOM are normal.  Cardiovascular: Normal rate, regular rhythm and normal heart sounds.   Pulmonary/Chest: Effort normal and breath sounds normal. No respiratory distress.  Musculoskeletal: Normal range of motion. She exhibits no edema or tenderness.  Neurological: She is alert and oriented to person, place, and time.  Skin: Skin is warm and dry. She is not diaphoretic.  Psychiatric: She has a normal mood and affect. Her behavior is normal. Judgment and thought content normal.          Assessment & Plan:

## 2015-03-29 ENCOUNTER — Ambulatory Visit (INDEPENDENT_AMBULATORY_CARE_PROVIDER_SITE_OTHER): Payer: Medicare Other | Admitting: Internal Medicine

## 2015-03-29 ENCOUNTER — Encounter: Payer: Self-pay | Admitting: Internal Medicine

## 2015-03-29 VITALS — BP 120/76 | HR 72 | Temp 98.3°F | Wt 150.2 lb

## 2015-03-29 DIAGNOSIS — G14 Postpolio syndrome: Secondary | ICD-10-CM | POA: Diagnosis not present

## 2015-03-29 DIAGNOSIS — Z993 Dependence on wheelchair: Secondary | ICD-10-CM

## 2015-03-29 DIAGNOSIS — D509 Iron deficiency anemia, unspecified: Secondary | ICD-10-CM

## 2015-03-29 NOTE — Patient Instructions (Signed)
I will get the info to [employer] by Friday 5 PM

## 2015-03-30 NOTE — Assessment & Plan Note (Signed)
She is doing well on the oral iron and having no side effects. She is taking it with OJ.

## 2015-03-30 NOTE — Assessment & Plan Note (Signed)
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 Her limitations are progressive. Currently, she can walk a short distance if she has her two canes but she is unable to stand / walk for long enough periods of time to do the activities (cooking, cleaning, moving around her home, etc) that she needs to do daily. Previously when she was walking more (with two canes or a walker) she was falling frequ. She hasn't had a fall for some time since she is now walking less and using her powerchair more. She needs assistance bathing and washing / doing her hair. Ms Katie Barker cannot use a manual chair as she doesn't have the UE strength to move the chair, the hand strength to grip the wheels, and has shoulder pain and decreased ROM L>R. A scooter is not an option as she is already using a power chair and needs the support and stability of a power chair to maintain her sitting balance and to reposition herself while sitting.   MS / neuro exam R leg shorter and uses prosthetic shoe Trace edema LE B Strength Fist R4+, L 5 Elbow flexion L 4+, R 3-4 hard to tell bc shoulder pain Hip flexion R 1, L 2 Knee extension R 1, L 2 Knee flexion R 1, L 2 Ankle R 1, L 1 Atrophy of R lower leg, inc but not limited to posterior calf muscles Gait not tested bc she didn't have her 2 canes or her walker and with her degree of muscle weakness I did not feel it prudent to have her walk in an unfamiliar environment.   Ms Katie Barker is alert, cooperative, has demonstrated that she can safely operate a power wheelchair in the home, and has also demonstrated that she is willing and motivated to use the power wheelchair. She reports increased LE edema as the day progresses and her legs remain independent. She did not demonstrate LE edema today bc had only been sitting a couple of hours. I feel that she would benefit from leg elevation  in the powerchair but she wants to ensure that such a chair would fit into her [name].

## 2015-03-30 NOTE — Progress Notes (Signed)
   Subjective:    Patient ID: Joni ReiningEthel Lee Kozel, female    DOB: 1941-10-31, 74 y.o.   MRN: [id number]  HPI  [name] is here for a powerchair evaluation.   Review of Systems  Cardiovascular: Positive for leg swelling.  Gastrointestinal: Negative for abdominal pain.  Musculoskeletal: Positive for myalgias, arthralgias and gait problem.  Skin: Positive for rash.       Objective:   Physical Exam  Constitutional: She is oriented to person, place, and time. She appears well-developed and well-nourished. No distress.  HENT:  Head: Normocephalic and atraumatic.  Right Ear: External ear normal.  Left Ear: External ear normal.  Nose: Nose normal.  Eyes: Conjunctivae and EOM are normal.  Cardiovascular: Normal rate, regular rhythm and normal heart sounds.   Pulmonary/Chest: Effort normal and breath sounds normal. No respiratory distress.  Neurological: She is alert and oriented to person, place, and time.  Skin: Skin is warm and dry. She is not diaphoretic.  On B forearms, L>R, over radial ridge there are multiple small macules. No vesicles.   Psychiatric: She has a normal mood and affect. Her behavior is normal. Judgment and thought content normal.   Pls see A&P for other PE        Assessment & Plan:

## 2015-04-04 ENCOUNTER — Telehealth: Payer: Self-pay | Admitting: *Deleted

## 2015-04-04 NOTE — Telephone Encounter (Signed)
No problem.

## 2015-04-04 NOTE — Telephone Encounter (Signed)
Pt wants to let you know that the new "heartburn medicine" you put her on is "just not working" so she went back on the nexium

## 2015-04-23 ENCOUNTER — Other Ambulatory Visit: Payer: Self-pay | Admitting: *Deleted

## 2015-04-23 NOTE — Telephone Encounter (Signed)
Pt called stating she can not afford Nexium and iron. Ferrous Sulfate cost $19 a month and pt wants to know if there is anything less expensive. Nexium cost $ 800 a month.  She states she has tried other med's but they all cause her to itch.  Do you want us to see if we can get a PA on Nexium, or do you want to change to another PPI?

## 2015-04-23 NOTE — Telephone Encounter (Signed)
Iron - she may call her insurance company and see if they have an iron prep on their formulary that is less costly.  PPI - there unfortunately no documentation in EPIC about intolerance to other statins and I doubt her insurance will accept pt report of ithching for pre-auth. Her options are to but OTC, try the H2B again, try the pre-auth (although I doubt), or try what her insurance covers.

## 2015-04-24 DIAGNOSIS — R339 Retention of urine, unspecified: Secondary | ICD-10-CM | POA: Diagnosis not present

## 2015-04-25 NOTE — Telephone Encounter (Signed)
Spoke with patient again and on the Iron she feels she is allergic to something in the med.  She got the red bumps from taking it and she stopped three days ago and bumps are gone.  Can she try something else?  On the Nexium, she did not react to this med.  It was just to expensive.  She would like to try another PPI that is less expensive.  The first message came over the phone recording so not sure why it was inaccurate.

## 2015-04-26 MED ORDER — FERROUS GLUCONATE 325 (36 FE) MG PO TABS: 325.0000 mg | ORAL_TABLET | Freq: Every day | ORAL | Status: DC

## 2015-04-26 MED ORDER — ESOMEPRAZOLE MAGNESIUM 40 MG PO CPDR: DELAYED_RELEASE_CAPSULE | ORAL | Status: DC

## 2015-04-26 MED ORDER — OMEPRAZOLE 20 MG PO CPDR: 20.0000 mg | DELAYED_RELEASE_CAPSULE | Freq: Every day | ORAL | Status: DC

## 2015-04-26 NOTE — Telephone Encounter (Addendum)
PPI - I will Rx omeprazole. If her insurance doesn't cover it, she needs to call her insurance company and find out which ones they do Rx.  Iron - I Rx'd ferrous gluconate. She can try it. Low dose to prevent GI side effects. If she tolerates, may increase to 2 pills daily (still a low dose).

## 2015-04-27 NOTE — Telephone Encounter (Signed)
Pharmacy called and Nexium has been d/c

## 2015-05-10 ENCOUNTER — Emergency Department (INDEPENDENT_AMBULATORY_CARE_PROVIDER_SITE_OTHER)
Admission: EM | Admit: 2015-05-10 | Discharge: 2015-05-10 | Disposition: A | Discharge status: Home / Self Care | Payer: Medicare Other | Source: Home / Self Care | Attending: Family Medicine | Admitting: Family Medicine

## 2015-05-10 ENCOUNTER — Telehealth: Payer: Self-pay | Admitting: *Deleted

## 2015-05-10 ENCOUNTER — Encounter (HOSPITAL_COMMUNITY): Payer: Self-pay | Admitting: Emergency Medicine

## 2015-05-10 DIAGNOSIS — R05 Cough: Secondary | ICD-10-CM

## 2015-05-10 DIAGNOSIS — R059 Cough, unspecified: Secondary | ICD-10-CM

## 2015-05-10 DIAGNOSIS — J301 Allergic rhinitis due to pollen: Secondary | ICD-10-CM | POA: Diagnosis not present

## 2015-05-10 DIAGNOSIS — R0982 Postnasal drip: Secondary | ICD-10-CM | POA: Diagnosis not present

## 2015-05-10 MED ORDER — ALBUTEROL SULFATE HFA 108 (90 BASE) MCG/ACT IN AERS: 2.0000 | INHALATION_SPRAY | RESPIRATORY_TRACT | Status: DC | PRN

## 2015-05-10 MED ORDER — HYDROCOD POLST-CPM POLST ER 10-8 MG/5ML PO SUER: ORAL | Status: DC

## 2015-05-10 NOTE — Telephone Encounter (Signed)
Pt called in asking for another cough med as the one that was called in is over $200. I looke up that refill and it was from 3/10 ( tussionex ) I called the patient and she states she has a chronic cough and needs some medicine she can afford.  She has not been sleeping well for several weeks and is asking for something for sleep.  Pt # [id number]

## 2015-05-10 NOTE — ED Provider Notes (Signed)
CSN: [id number]     Arrival date & time 05/10/15  1455 History   First MD Initiated Contact with Patient 05/10/15 1624     Chief Complaint  Patient presents with  . Cough   (Consider location/radiation/quality/duration/timing/severity/associated sxs/prior Treatment) HPI Comments: 74 year old female accompanied by a younger significant other is been complaining of a cough for 2 months. She also has to clear her throat and occasionally has mild sore throat. She is seen her physician at least once as been in contact over the phone with her as well. She was given a prescription for Tussionex but was unable to get it filled to to either unavailability or insurance problems according to the significant other. She has no earache or fever. Denies shortness of breath except for the moment that she is having coughing spells. Coughing is worse at night.   Past Medical History  Diagnosis Date  . Anxiety 09/07/2012    Minimal benzo use. About 2 per month. Not on contract.   Marland Kitchen. HTN (hypertension) 09/07/2012    On triple drug (single pill) therapy. ARB, HCTZ, and amlodipine. BP well controlled at home. Elevated in MD's office.   Marland Kitchen. GERD (gastroesophageal reflux disease) 09/07/2012    Controlled on PPI PRN.   Marland Kitchen. Hyperlipemia 09/07/2012    Her 10 yr CV risk is <5%. LDL goal is < 160. Has been on a statin since about 2003.    Marland Kitchen. 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 Anemia, iron deficiency 09/07/2012    Labs from April 2013 include: HgB 9.7 MCV 64 RDW 17 (nl) Ferritin 7 Iron 14 % sat 14 TIBC 454 (high) Stool cards May 2013 negative x 3   . Overactive bladder 09/07/2012    Sees urology yearly. Sxs well controlled on Toviaz.   . Osteopenia 03/17/2014    Bone density 01/2014 : dual femur -2.1, R forearm -0.7   Past Surgical History  Procedure Laterality Date  . Carpal tunnel release      On the L wrist during her late 40's  . Total abdominal hysterectomy w/ bilateral salpingoophorectomy  03/09/2000    Cervix was removed.  Reason for TAH BSO was fibroids   History reviewed. No pertinent family history. History  Substance Use Topics  . Smoking status: Former Smoker    Types: Cigarettes    Quit date: 03/08/1975  . Smokeless tobacco: Not on file  . Alcohol Use: Not on file   OB History    No data available     Review of Systems  Constitutional: Negative for fever, chills, activity change, appetite change and fatigue.  HENT: Positive for congestion, postnasal drip and rhinorrhea. Negative for facial swelling.   Eyes: Negative.   Respiratory: Positive for cough. Negative for wheezing.   Cardiovascular: Negative.   Gastrointestinal: Negative.   Musculoskeletal: Negative for neck pain and neck stiffness.  Skin: Negative for pallor and rash.  Neurological: Negative.   Psychiatric/Behavioral: Negative.     Allergies  Penicillins; Lipitor; and Pravastatin  Home Medications   Prior to Admission medications   Medication Sig Start Date End Date Taking? Authorizing Provider  aspirin 81 MG tablet Take 81 mg by mouth daily.   Yes Historical Provider, MD  cholecalciferol (VITAMIN D) 1000 UNITS tablet Take 1,000 Units by mouth daily. OTC med.   Yes Historical Provider, MD  ibuprofen (ADVIL,MOTRIN) 200 MG tablet Take 200 mg by mouth every 6 (six) hours as needed.   Yes Historical Provider, MD  omeprazole (PRILOSEC) 20 MG capsule Take  1 capsule (20 mg total) by mouth daily. 04/26/15  Yes Burns SpainElizabeth A Butcher, MD  TOVIAZ 8 MG TB24  08/14/12  Yes Historical Provider, MD  TRIBENZOR 20-5-12.5 MG TABS TAKE ONE TABLET BY MOUTH ONCE DAILY 09/11/14  Yes Burns SpainElizabeth A Butcher, MD  vitamin E 100 UNIT capsule Take 400 Units by mouth daily. OTC med   Yes Historical Provider, MD  albuterol (PROVENTIL HFA;VENTOLIN HFA) 108 (90 BASE) MCG/ACT inhaler Inhale 2 puffs into the lungs every 4 (four) hours as needed for wheezing or shortness of breath. 05/10/15   Hayden Rasmussenavid Jasmeet Gehl, NP  chlorpheniramine-HYDROcodone [facility](TUSSIONEX PENNKINETIC ER) 10-8  MG/5ML SUER 2.5 to 5 ml po q 12h prn cough and drainage. 05/10/15   Hayden Rasmussenavid Tashiba Timoney, NP  Ferrous Gluconate 325 (36 FE) MG TABS Take 325 mg by mouth daily. 04/26/15   Burns SpainElizabeth A Butcher, MD  traMADol (ULTRAM) 50 MG tablet Take 1 tablet (50 mg total) by mouth every 6 (six) hours as needed. 07/13/14   Burns SpainElizabeth A Butcher, MD   BP 130/79 mmHg  Pulse 91  Temp(Src) 98.5 F (36.9 C) (Oral)  Resp 16  SpO2 99%  LMP 06/15/1987 Physical Exam  Constitutional: She is oriented to person, place, and time. She appears well-developed and well-nourished. No distress.  HENT:  Right TM is normal. Left TM obscured by cerumen. Oropharynx with minor  streaky erythema and clear PND. no exudates.   Eyes: Conjunctivae and EOM are normal.  Neck: Normal range of motion. Neck supple.  Cardiovascular: Normal rate, regular rhythm and normal heart sounds.   Pulmonary/Chest: Effort normal and breath sounds normal. No respiratory distress. She has no wheezes. She has no rales.  Musculoskeletal: Normal range of motion. She exhibits no edema.  Lymphadenopathy:    She has no cervical adenopathy.  Neurological: She is alert and oriented to person, place, and time.  Skin: Skin is warm and dry. No rash noted.  Psychiatric: She has a normal mood and affect.  Nursing note and vitals reviewed.   ED Course  Procedures (including critical care time) Labs Review Labs Reviewed - No data to display  Imaging Review No results found.   MDM   1. Allergic rhinitis due to pollen   2. Cough   3. PND (post-nasal drip)    Drink lots of water Tussionex for cough and drainage. Can cause much drowsiness. Recommend lower dose and be watched by family.  During day may use claritin if needed for drainge. Albuterol HFA if needed for coughing spasm. Although her lungs are clear today she is describing coughing spasms that occur day and night. She may be having occult bronchospasm. She is given a prescription for the albuterol to try in  case the medicine is not working for her cough. She is to follow-up with her PCP. If worsening symptoms or problems may return or go to emergency department.   Hayden Rasmussenavid Frisco Cordts, NP 05/10/15 [phone]

## 2015-05-10 NOTE — Discharge Instructions (Signed)
Allergic Rhinitis Saline nasal spray Drink lots of water Tussionex for cough and drainage. Can cause much drowsiness. Recommend lower dose and be watched by family.  During day may use claritin. Albuterol HFA if needed for coughing spasm Allergic rhinitis is when the mucous membranes in the nose respond to allergens. Allergens are particles in the air that cause your body to have an allergic reaction. This causes you to release allergic antibodies. Through a chain of events, these eventually cause you to release histamine into the blood stream. Although meant to protect the body, it is this release of histamine that causes your discomfort, such as frequent sneezing, congestion, and an itchy, runny nose.  CAUSES  Seasonal allergic rhinitis (hay fever) is caused by pollen allergens that may come from grasses, trees, and weeds. Year-round allergic rhinitis (perennial allergic rhinitis) is caused by allergens such as house dust mites, pet dander, and mold spores.  SYMPTOMS   Nasal stuffiness (congestion).  Itchy, runny nose with sneezing and tearing of the eyes. DIAGNOSIS  Your health care provider can help you determine the allergen or allergens that trigger your symptoms. If you and your health care provider are unable to determine the allergen, skin or blood testing may be used. TREATMENT  Allergic rhinitis does not have a cure, but it can be controlled by:  Medicines and allergy shots (immunotherapy).  Avoiding the allergen. Hay fever may often be treated with antihistamines in pill or nasal spray forms. Antihistamines block the effects of histamine. There are over-the-counter medicines that may help with nasal congestion and swelling around the eyes. Check with your health care provider before taking or giving this medicine.  If avoiding the allergen or the medicine prescribed do not work, there are many new medicines your health care provider can prescribe. Stronger medicine may be used if  initial measures are ineffective. Desensitizing injections can be used if medicine and avoidance does not work. Desensitization is when a patient is given ongoing shots until the body becomes less sensitive to the allergen. Make sure you follow up with your health care provider if problems continue. HOME CARE INSTRUCTIONS It is not possible to completely avoid allergens, but you can reduce your symptoms by taking steps to limit your exposure to them. It helps to know exactly what you are allergic to so that you can avoid your specific triggers. SEEK MEDICAL CARE IF:   You have a fever.  You develop a cough that does not stop easily (persistent).  You have shortness of breath.  You start wheezing.  Symptoms interfere with normal daily activities. Document Released: 09/09/2001 Document Revised: 12/20/2013 Document Reviewed: 08/22/2013 [facility] Patient Information 2015 [employer] [place], [place]. This information is not intended to replace advice given to you by your health care provider. Make sure you discuss any questions you have with your health care provider.

## 2015-05-10 NOTE — ED Notes (Signed)
C/o  Productive cough with clear sputum.  Symptom present since march.  States "seem like its getting worse".   No relief with otc meds.   Several vomiting episodes from coughing.

## 2015-05-11 NOTE — Telephone Encounter (Signed)
Pt informed, she states a neighbor suggested an OTC sleep aid, she tried it and it worked.  I gave her the info on Melatonin and she will use if needed. Also suggested OTC Robitussin for cough

## 2015-05-11 NOTE — Telephone Encounter (Signed)
I suggest melatonin which is available OTC. Try 0.1 - 0.3 mg at bedtime.   She was seen in ED and Rx tussionex, alb, and claritin.

## 2015-05-14 ENCOUNTER — Telehealth: Payer: Self-pay | Admitting: *Deleted

## 2015-05-14 DIAGNOSIS — R0982 Postnasal drip: Secondary | ICD-10-CM

## 2015-05-14 MED ORDER — BENZONATATE 100 MG PO CAPS: 200.0000 mg | ORAL_CAPSULE | Freq: Three times a day (TID) | ORAL | Status: DC | PRN

## 2015-05-14 NOTE — Telephone Encounter (Signed)
Spoke with pt's dtr @ [phone] to inform her that rx for tessalon perles is ready for pickup. Pt was instructed to return to clinic/urgent care/ ED if symptoms worsens or does not improve.  Phone call complete.Katie Barker, Katie Cassady5/16/20164:29 PM

## 2015-05-14 NOTE — Telephone Encounter (Signed)
Call from pt's daughter, [facility] and was rx'd albuterol inhaler and tussionex.  Per insurance PA was need for both meds.  Albuterol inhaler changed to the preferred ProAir-cost to pt $3.60. PA request sent to pt's insurance for the tussionex, but it was denied with the following statement: "Drugs used for the symptomatic relief of cough or colds are excluded from coverage under Medicare rules."  Pt's daughter would like to try the tessalon Perles (benzonatate) if it's ok to take with her other meds.  Will forward info to attending MD, please advise    The following prices are out of pocket costs if insurance does not cover.  [facility]  Benzonate  100mg  #14 $4 100mg  #42 $10  200mg  #15 $9.59 (good rx card) 200mg  #20 $11.12 (good rx card) 200mg  #30 $ 14.19 (good rx card)

## 2015-05-14 NOTE — Telephone Encounter (Signed)
Spoke with Nurse Purnell ShoemakerKaye and will attempt tessalon perles for her cough at night and see if these work.  200mg , 20 tab Rx was sent in for her.

## 2015-06-14 ENCOUNTER — Encounter: Payer: Medicare Other | Admitting: Internal Medicine

## 2015-07-12 ENCOUNTER — Ambulatory Visit: Payer: Medicare Other | Admitting: Physical Therapy

## 2015-07-24 DIAGNOSIS — R339 Retention of urine, unspecified: Secondary | ICD-10-CM | POA: Diagnosis not present

## 2015-08-07 ENCOUNTER — Ambulatory Visit (INDEPENDENT_AMBULATORY_CARE_PROVIDER_SITE_OTHER): Payer: Medicare Other | Admitting: Internal Medicine

## 2015-08-07 ENCOUNTER — Encounter: Payer: Self-pay | Admitting: Internal Medicine

## 2015-08-07 VITALS — BP 117/45 | HR 97 | Temp 98.4°F

## 2015-08-07 DIAGNOSIS — R21 Rash and other nonspecific skin eruption: Secondary | ICD-10-CM | POA: Insufficient documentation

## 2015-08-07 DIAGNOSIS — D509 Iron deficiency anemia, unspecified: Secondary | ICD-10-CM

## 2015-08-07 DIAGNOSIS — F419 Anxiety disorder, unspecified: Secondary | ICD-10-CM

## 2015-08-07 DIAGNOSIS — L270 Generalized skin eruption due to drugs and medicaments taken internally: Secondary | ICD-10-CM | POA: Diagnosis not present

## 2015-08-07 MED ORDER — DIAZEPAM 2 MG PO TABS: 2.0000 mg | ORAL_TABLET | ORAL | Status: DC | PRN

## 2015-08-07 MED ORDER — HYDROCORTISONE 1 % EX CREA: 1.0000 "application " | TOPICAL_CREAM | Freq: Two times a day (BID) | CUTANEOUS | Status: DC

## 2015-08-07 NOTE — Progress Notes (Signed)
74 year old female with iron deficiency anemia who was unable to tolerate iron sulfate pills in the past comes in with a rash after restarting iron pills. Rash resolving after discontinuing iron supplementation. Examined patient with Dr Dimple Casey. Papular non-erythematous rash present most notably on arms bilaterally, neck and thighs. Agree with Dr Rice's plan to start 1% hydrocortisone cream to apply and benadryl as needed for itching. Patient's hemoglobin depreciated from 12.4 in 2014 to 9.9 in 02/2015 with Ferritin 13 and Iron 18. Patient to discuss with Dr Rogelia Boga (PCP) regarding further iron supplementation.  Aletta Edouard MD MPH 08/07/2015 4:36 PM

## 2015-08-07 NOTE — Patient Instructions (Addendum)
Today we discontinued iron replacement therapy. You made need to restart this treatment for your low blood levels, we will recheck at your next appointment in October.  A prescription for a hydrocortisone cream was sent to your pharmacy, use this up to 2 times daily for your rash. You can take 1  benadryl at night for itching relief, this may cause drowsiness.  If your rash worsens greatly or you develop new symptoms such as shortness of breath, please call us do not wait for your next appointment.

## 2015-08-07 NOTE — Progress Notes (Signed)
Subjective:   Patient ID: [name] female   DOB: Jan 15, 1941 74 y.o.   MRN: [id number]  HPI: Ms.Katie Barker is a 74 y.o. with PMHx described below presenting for rash present for the past 6-8 weeks. This rash started on her forearms with small bumps progressing to large circular wheals. She also noted rash on the sides of her neck and inner thighs. 2 weeks ago she discontinued all medications due to concern the rash was drug related. After 1 week wheals had mostly resolved but areas remained significantly pruritic. She resumed all of her medications except ferrous gluconate at that time. She developed a similar rash in 2015, at that time she was taking ferrous sulfate for iron deficiency anemia which was proposed to be the triggering drug. She is currently using an OTC lotion for skin with minimal improvement in symptoms, and taking no other medications for her rash.   Past Medical History  Diagnosis Date  . Anxiety 09/07/2012    Minimal benzo use. About 2 per month. Not on contract.   Marland Kitchen HTN (hypertension) 09/07/2012    On triple drug (single pill) therapy. ARB, HCTZ, and amlodipine. BP well controlled at home. Elevated in MD's office.   Marland Kitchen GERD (gastroesophageal reflux disease) 09/07/2012    Controlled on PPI PRN.   Marland Kitchen Hyperlipemia 09/07/2012    Her 10 yr CV risk is <5%. LDL goal is < 160. Has been on a statin since about 2003.    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 Anemia, iron deficiency 09/07/2012    Labs from April 2013 include: HgB 9.7 MCV 64 RDW 17 (nl) Ferritin 7 Iron 14 % sat 14 TIBC 454 (high) Stool cards May 2013 negative x 3   . Overactive bladder 09/07/2012    Sees urology yearly. Sxs well controlled on Toviaz.   . Osteopenia 03/17/2014    Bone density 01/2014 : dual femur -2.1, R forearm -0.7   Current Outpatient Prescriptions  Medication Sig Dispense Refill  . albuterol (PROVENTIL HFA;VENTOLIN HFA) 108 (90 BASE) MCG/ACT inhaler Inhale 2 puffs into the lungs every 4  (four) hours as needed for wheezing or shortness of breath. 1 Inhaler 0  . aspirin 81 MG tablet Take 81 mg by mouth daily.    . benzonatate (TESSALON PERLES) 100 MG capsule Take 2 capsules (200 mg total) by mouth 3 (three) times daily as needed for cough. 20 capsule 0  . chlorpheniramine-HYDROcodone (TUSSIONEX PENNKINETIC ER) 10-8 MG/5ML SUER 2.5 to 5 ml po q 12h prn cough and drainage. 90 mL 0  . cholecalciferol (VITAMIN D) 1000 UNITS tablet Take 1,000 Units by mouth daily. OTC med.    . diazepam (VALIUM) 2 MG tablet Take 1 tablet (2 mg total) by mouth as needed for anxiety. 10 tablet 0  . hydrocortisone cream 1 % Apply 1 application topically 2 (two) times daily. 30 g 0  . ibuprofen (ADVIL,MOTRIN) 200 MG tablet Take 200 mg by mouth every 6 (six) hours as needed.    Marland Kitchen omeprazole (PRILOSEC) 20 MG capsule Take 1 capsule (20 mg total) by mouth daily. 30 capsule 5  . TOVIAZ 8 MG TB24     . TRIBENZOR 20-5-12.5 MG TABS TAKE ONE TABLET BY MOUTH ONCE DAILY 30 tablet 11  . vitamin E 100 UNIT capsule Take 400 Units by mouth daily. OTC med     No current facility-administered medications for this visit.   No family history on file. History   Social History  .  Marital Status: Divorced    Spouse Name: N/A  . Number of Children: N/A  . Years of Education: N/A   Social History Main Topics  . Smoking status: Former Smoker    Types: Cigarettes    Quit date: 03/08/1975  . Smokeless tobacco: Not on file  . Alcohol Use: Not on file  . Drug Use: Not on file  . Sexual Activity: Not on file   Other Topics Concern  . None   Social History Narrative   Lives alone in an apartment. Independent in ADL's. Drives. Has motorized WC for long distances. Otherwise, uses cane. On disability for post-polio syndrome. Has mail delivered to door. Has permanent handicapped plate.    Review of Systems: Review of Systems  Constitutional: Negative for fever and chills.  HENT: Negative for congestion.   Respiratory:  Positive for cough. Negative for sputum production and shortness of breath.   Cardiovascular: Negative for chest pain.  Gastrointestinal: Negative for nausea, diarrhea and constipation.  Musculoskeletal: Positive for back pain.  Skin: Positive for rash.  Neurological: Negative for headaches.    Objective:  Physical Exam: Filed Vitals:   08/07/15 1544  BP: 117/45  Pulse: 97  Temp: 98.4 F (36.9 C)  TempSrc: Oral  SpO2: 100%   GENERAL- alert, well-groomed, sitting in power chair, NAD HEENT- Oral mucosa appears moist, nonerythematous CARDIAC- RRR, no murmurs, rubs or gallops. RESP- CTAB, no wheezes or crackles. EXTREMITIES- R leg smaller, shorter than left with minimal strength, no pedal edema b/l. SKIN- Papular non-erythematous rash on majority length of the forearms bilaterally, smaller rash on neck bilaterally PSYCH- Normal mood and affect, appropriate thought content and speech.  Assessment & Plan:

## 2015-08-08 NOTE — Assessment & Plan Note (Signed)
Presenting with rash which may be related to oral iron supplement or other medications. Patient does not have other viral symptoms that may explain a spontaneous rash, and has persisted for several weeks. Rash reported to be improving after medication cessation but has not fully resolved after 2 weeks.  - Hydrocortisone cream 1% apply BID for rash and itch - Patient recommended to take  benadryl qHS PRN for pruritis - Holding all iron supplementation at this time, will need to reevaluate her this at subsequent visit

## 2015-08-08 NOTE — Assessment & Plan Note (Signed)
Oral iron was held as a contributor to a suspected drug rash 08/08/15. She does have a history of trouble tolerating ferrous sulfate supplementation. If rash fails to resolve with iron stopped, other suspects should be considered. Given her iron deficiency anemia this will require reassessment in a future visit.

## 2015-08-08 NOTE — Assessment & Plan Note (Addendum)
Uses mostly for anxiety related to doctor's appointments. Valium refilled 10 pills PRN. Her use over the past year is <1 pill per month. No history of abuse, does not use a sleep aid, and never taking multiple doses concurrently.

## 2015-08-14 NOTE — Progress Notes (Signed)
I saw and evaluated the patient.  I personally confirmed the key portions of the history and exam documented by Dr. Rice and I reviewed pertinent patient test results.  The assessment, diagnosis, and plan were formulated together and I agree with the documentation in the resident's note. 

## 2015-09-06 ENCOUNTER — Ambulatory Visit: Payer: Medicare Other | Admitting: Rehabilitative and Restorative Service Providers"

## 2015-09-06 DIAGNOSIS — R32 Unspecified urinary incontinence: Secondary | ICD-10-CM | POA: Diagnosis not present

## 2015-09-06 DIAGNOSIS — N319 Neuromuscular dysfunction of bladder, unspecified: Secondary | ICD-10-CM | POA: Diagnosis not present

## 2015-09-14 ENCOUNTER — Other Ambulatory Visit: Payer: Self-pay | Admitting: Internal Medicine

## 2015-09-20 ENCOUNTER — Ambulatory Visit: Payer: Medicare Other | Attending: Internal Medicine | Admitting: Physical Therapy

## 2015-09-20 ENCOUNTER — Ambulatory Visit: Payer: Medicare Other | Admitting: Physical Therapy

## 2015-09-20 DIAGNOSIS — R262 Difficulty in walking, not elsewhere classified: Secondary | ICD-10-CM | POA: Diagnosis not present

## 2015-09-20 DIAGNOSIS — M6281 Muscle weakness (generalized): Secondary | ICD-10-CM | POA: Insufficient documentation

## 2015-09-21 ENCOUNTER — Encounter: Payer: Self-pay | Admitting: Physical Therapy

## 2015-09-21 NOTE — Therapy (Signed)
[facility] [address] [place], [place], [address] Phone: [phone]   Fax:  [phone]  Physical Therapy Evaluation  Patient Details  Name: Katie Barker MRN: [id number] Date of Birth: 08/07/1941 Referring Kairo Laubacher:  Burns Spain, MD  Encounter Date: Oct 02, 2015      PT End of Session - 09/21/15 0955    Authorization Type [employer] medicare      Past Medical History  Diagnosis Date  . Anxiety 09/07/2012    Minimal benzo use. About 2 per month. Not on contract.   Marland Kitchen HTN (hypertension) 09/07/2012    On triple drug (single pill) therapy. ARB, HCTZ, and amlodipine. BP well controlled at home. Elevated in MD's office.   Marland Kitchen GERD (gastroesophageal reflux disease) 09/07/2012    Controlled on PPI PRN.   Marland Kitchen Hyperlipemia 09/07/2012    Her 10 yr CV risk is <5%. LDL goal is < 160. Has been on a statin since about 2003.    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 Anemia, iron deficiency 09/07/2012    Labs from April 2013 include: HgB 9.7 MCV 64 RDW 17 (nl) Ferritin 7 Iron 14 % sat 14 TIBC 454 (high) Stool cards May 2013 negative x 3   . Overactive bladder 09/07/2012    Sees urology yearly. Sxs well controlled on Toviaz.   . Osteopenia 03/17/2014    Bone density 01/2014 : dual femur -2.1, R forearm -0.7    Past Surgical History  Procedure Laterality Date  . Carpal tunnel release      On the L wrist during her late 40's  . Total abdominal hysterectomy w/ bilateral salpingoophorectomy  03/09/2000    Cervix was removed. Reason for TAH BSO was fibroids    There were no vitals filed for this visit.  Visit Diagnosis:  Generalized muscle weakness - Plan: PT plan of care cert/re-cert  Difficulty walking - Plan: PT plan of care cert/re-cert     Power wheelchair eval completed with Harrie Jeans, ATP from [employer]; eval to be added by addendum when LMN Is complete                                       G-Codes - 10-02-15 0955    Functional Assessment Tool Used Pt is nonambulatory - uses power wheelchair for mobility   Functional Limitation Mobility   Mobility: Walking and Moving Around Current Status (W4132) CM   Mobility: Walking and Moving Around Goal Status (G4010) CM   Mobility: Walking and Moving Around Discharge Status (U7253) CM       Problem List Patient Active Problem List   Diagnosis Date Noted  . Rash, drug 08/07/2015  . Osteopenia 03/17/2014  . GERD (gastroesophageal reflux disease) 09/07/2012  . Anxiety 09/07/2012  . HTN (hypertension) 09/07/2012  . Hyperlipemia 09/07/2012  . Overactive bladder 09/07/2012  . 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 Anemia, iron deficiency 09/07/2012  . Health care maintenance 09/07/2012  . Constipation 09/07/2012    DildayDonavan Burnet, PT 09/21/2015, 10:12 AM  [facility] [address] [place], [place], [address] Phone: [phone]   Fax:  [phone]

## 2015-10-09 DIAGNOSIS — N3941 Urge incontinence: Secondary | ICD-10-CM | POA: Diagnosis not present

## 2015-10-09 DIAGNOSIS — N390 Urinary tract infection, site not specified: Secondary | ICD-10-CM | POA: Diagnosis not present

## 2015-10-11 ENCOUNTER — Ambulatory Visit (HOSPITAL_COMMUNITY)
Admission: RE | Admit: 2015-10-11 | Discharge: 2015-10-11 | Disposition: A | Discharge status: Home / Self Care | Payer: Medicare Other | Source: Ambulatory Visit | Attending: Internal Medicine | Admitting: Internal Medicine

## 2015-10-11 ENCOUNTER — Ambulatory Visit (INDEPENDENT_AMBULATORY_CARE_PROVIDER_SITE_OTHER): Payer: Medicare Other | Admitting: Internal Medicine

## 2015-10-11 ENCOUNTER — Encounter: Payer: Self-pay | Admitting: Internal Medicine

## 2015-10-11 VITALS — BP 134/86 | HR 87 | Temp 98.7°F | Wt 150.7 lb

## 2015-10-11 DIAGNOSIS — R053 Chronic cough: Secondary | ICD-10-CM

## 2015-10-11 DIAGNOSIS — G14 Postpolio syndrome: Secondary | ICD-10-CM

## 2015-10-11 DIAGNOSIS — I10 Essential (primary) hypertension: Secondary | ICD-10-CM

## 2015-10-11 DIAGNOSIS — D509 Iron deficiency anemia, unspecified: Secondary | ICD-10-CM

## 2015-10-11 DIAGNOSIS — Z0000 Encounter for general adult medical examination without abnormal findings: Secondary | ICD-10-CM

## 2015-10-11 DIAGNOSIS — E785 Hyperlipidemia, unspecified: Secondary | ICD-10-CM

## 2015-10-11 DIAGNOSIS — R05 Cough: Secondary | ICD-10-CM | POA: Diagnosis not present

## 2015-10-11 DIAGNOSIS — N3281 Overactive bladder: Secondary | ICD-10-CM

## 2015-10-11 DIAGNOSIS — Z87891 Personal history of nicotine dependence: Secondary | ICD-10-CM

## 2015-10-11 MED ORDER — OMEPRAZOLE 20 MG PO CPDR: 20.0000 mg | DELAYED_RELEASE_CAPSULE | Freq: Two times a day (BID) | ORAL | Status: DC

## 2015-10-11 MED ORDER — FLUTICASONE PROPIONATE 50 MCG/ACT NA SUSP: 2.0000 | Freq: Every day | NASAL | Status: DC

## 2015-10-11 NOTE — Assessment & Plan Note (Signed)
BP Readings from Last 3 Encounters:  10/11/15 134/86  08/07/15 117/45  05/10/15 130/79   Well controlled on her Tribenzor. Cont medication.

## 2015-10-11 NOTE — Assessment & Plan Note (Signed)
She states that mobility needs my note with a new date. Will ask [facility] to investigate.

## 2015-10-11 NOTE — Progress Notes (Signed)
   Subjective:    Patient ID: Katie Barker, female    DOB: [birth year], 74 y.o.   MRN: [id number]  HPI  Katie Reiningthel Lee Luchsinger is here for chronic cough. Please see the A&P for the status of the pt's chronic medical problems.   Review of Systems  Constitutional: Negative for fever, activity change and unexpected weight change.  HENT: Positive for rhinorrhea and sneezing.        Scratchy throat  Eyes: Positive for discharge, redness and itching.  Respiratory: Positive for cough and shortness of breath.        Cough is productive of mucous  Cardiovascular: Positive for chest pain. Negative for leg swelling.  Gastrointestinal: Positive for vomiting.       Cough makes her gag  Musculoskeletal: Positive for back pain.  Skin: Positive for color change and rash.  Psychiatric/Behavioral: Positive for sleep disturbance. The patient is not hyperactive.        Objective:   Physical Exam  Constitutional: She is oriented to person, place, and time. She appears well-developed and well-nourished. No distress.  HENT:  Head: Normocephalic and atraumatic.  Right Ear: External ear normal.  Left Ear: External ear normal.  Nose: Nose normal.  Mouth/Throat: Oropharynx is clear and moist.  Eyes: EOM are normal.  Injected sclera B. Tearing of eyes with cough spells.   Cardiovascular: Normal rate, regular rhythm and normal heart sounds.   Pulmonary/Chest: Effort normal and breath sounds normal.  Cannot take deep breaths 2/2 causing coughing. Diminished on R but otherwise nl.  Musculoskeletal: She exhibits no edema or tenderness.  Neurological: She is alert and oriented to person, place, and time.  Skin: Skin is warm and dry. She is not diaphoretic.  Hypopigmented patches on arms B  Psychiatric: She has a normal mood and affect. Her behavior is normal. Judgment and thought content normal.   Quit tobacco in her 30's.    Assessment & Plan:

## 2015-10-11 NOTE — Patient Instructions (Signed)
1. Take your nexium twice a day, 30 min before bedtime 2. takie benedryl (only at night if can't tolerate it in day) to dry the secretions 3. Take the nose spray as directed 4. Use the albuterol - 2 puffs twice a day 5. I will let you know what your chest Xray shows 6 Come back in 2-4 weeks. If no better, we will discuss referral

## 2015-10-11 NOTE — Assessment & Plan Note (Signed)
No statin per pt choice. LDL 140 in March of this yr.

## 2015-10-11 NOTE — Assessment & Plan Note (Signed)
Did not tolerate th pills. The solution cause the whelps which bc hypopigmented lesions. Not interested in IV iron. Will send list of high iron foods and I rec that she combine them with Vit C.

## 2015-10-11 NOTE — Assessment & Plan Note (Signed)
No longer taking toviaz and I removed from her med list

## 2015-10-11 NOTE — Assessment & Plan Note (Addendum)
Has had cough since March 2016. She also has a lot of allergic type sxs. Her sxs get worse at night. Never had h/o allergies or asthma. States had PFT's in past but not in [facility]. Ex-smoker. She gets coughing spells that cause her to vomit and take some time to resolve. She had in office. Most of sputum is clear, frothy - saliva with sputum mixed in. No blood. [facility] told [facility] but I am unable to locate those notes. Seen in [facility] once and told had allergies, PND, and bronchospasm and Rx albuterol, claritin, and tussionex. Pt states got the albuterol but didn't help. Didn't mention the claritin. Insurance didn't cover the tussionex. Using a mixture at bedtime which includes vodka to dampen the cough so can sleep. Also tried OTC benedryl. Sxs did not get worse when changed from PPI to H2B nor better when resumed PPI. Takes it in AM.  Assessment : there are a lot of allergic components to her sxs. Occult GERD can also be a trigger of her cough and can further be aggravated by the cough. COPD is a possibility of her chronic cough and sputum but not her allergic sxs. Atypical PNA's prob would not last 8 lbs. Due to all of the accompanying sxs, I really think allergies are top on diff dx.  Plan :  1. CXR - since cough is present for 7 months  2. Nasal corticosteroid for any allergic component 3. Benedryl to tx allergic and non allergic rhinitis 4. Albuterol BID to tx any possible bronchospasm 5. Increase PPI to BID - 30 min prior to meals 6. RTC 2-4 weeks. If no better, consider CT sinuses +/- referral +/- PFT's.

## 2015-10-12 ENCOUNTER — Other Ambulatory Visit: Payer: Self-pay | Admitting: Internal Medicine

## 2015-10-12 ENCOUNTER — Encounter: Payer: Self-pay | Admitting: Internal Medicine

## 2015-10-12 DIAGNOSIS — R32 Unspecified urinary incontinence: Secondary | ICD-10-CM | POA: Diagnosis not present

## 2015-10-12 DIAGNOSIS — N319 Neuromuscular dysfunction of bladder, unspecified: Secondary | ICD-10-CM | POA: Diagnosis not present

## 2015-10-12 LAB — CBC WITH DIFFERENTIAL/PLATELET
Basophils Absolute: 0 10*3/uL (ref 0.0–0.2)
Basos: 0 %
EOS (ABSOLUTE): 0.2 10*3/uL (ref 0.0–0.4)
EOS: 3 %
Hematocrit: 33.5 % — ABNORMAL LOW (ref 34.0–46.6)
Hemoglobin: 10.1 g/dL — ABNORMAL LOW (ref 11.1–15.9)
IMMATURE GRANULOCYTES: 1 %
Immature Grans (Abs): 0 10*3/uL (ref 0.0–0.1)
Lymphocytes Absolute: 1.2 10*3/uL (ref 0.7–3.1)
Lymphs: 23 %
MCH: 21.1 pg — ABNORMAL LOW (ref 26.6–33.0)
MCHC: 30.1 g/dL — ABNORMAL LOW (ref 31.5–35.7)
MCV: 70 fL — ABNORMAL LOW (ref 79–97)
MONOS ABS: 0.4 10*3/uL (ref 0.1–0.9)
Monocytes: 7 %
NEUTROS PCT: 66 %
Neutrophils Absolute: 3.3 10*3/uL (ref 1.4–7.0)
Platelets: 403 10*3/uL — ABNORMAL HIGH (ref 150–379)
RBC: 4.78 x10E6/uL (ref 3.77–5.28)
RDW: 16.5 % — AB (ref 12.3–15.4)
WBC: 5 10*3/uL (ref 3.4–10.8)

## 2015-10-12 LAB — FERRITIN: Ferritin: 8 ng/mL — ABNORMAL LOW (ref 15–150)

## 2015-10-12 MED ORDER — ALBUTEROL SULFATE HFA 108 (90 BASE) MCG/ACT IN AERS: 2.0000 | INHALATION_SPRAY | Freq: Two times a day (BID) | RESPIRATORY_TRACT | Status: DC

## 2015-10-15 ENCOUNTER — Telehealth: Payer: Self-pay | Admitting: Internal Medicine

## 2015-10-15 DIAGNOSIS — R053 Chronic cough: Secondary | ICD-10-CM

## 2015-10-15 DIAGNOSIS — R05 Cough: Secondary | ICD-10-CM

## 2015-10-15 NOTE — Telephone Encounter (Signed)
PT HAS HAD REACTION TO OMEPRAZOLE AGAIN. WOULD LIKE REFERRAL TO DOCTOR FOR ALLERGY TESTS

## 2015-10-16 MED ORDER — ESOMEPRAZOLE MAGNESIUM 40 MG PO CPDR: 40.0000 mg | DELAYED_RELEASE_CAPSULE | Freq: Two times a day (BID) | ORAL | Status: DC

## 2015-10-16 NOTE — Telephone Encounter (Signed)
Returned call to patient.  She states she is concerned that she is still coughing and sneezing.  She has been doing her inhaler as prescribed without change. She would like to know results of recent x-ray.  She also would like referral for someone to test her for allergies as she feels her coughing and sneezing may mean she is allergic to something. In regards to omeprazole, she has not taken this medication in "months," the last time she did she got hives on her chest and arms.  She reports taking Nexium twice daily. She has a follow-up apt scheduled for 10/27.

## 2015-10-16 NOTE — Telephone Encounter (Signed)
Attempt to call patient back.  Left voicemail

## 2015-10-16 NOTE — Telephone Encounter (Signed)
I apoligze for the wrong PPI. I corrected the med list CXR was normal. She is taking the albuterol and PPI. What about the nasal steroid spray and benedryl? I mailed her her lab results

## 2015-10-17 NOTE — Telephone Encounter (Signed)
Spoke with patients daughter.  Patient is taking all of the rx as prescribed (verified as PPI, benedryl, flonase, & inhaler.)  Daughter reports she has coughing "fits," as well as vomiting her food and seems to be getting worse rather than better.  They want referral to "ENT or pulmonary."  Please advise.

## 2015-10-18 ENCOUNTER — Telehealth: Payer: Self-pay | Admitting: *Deleted

## 2015-10-18 NOTE — Telephone Encounter (Signed)
Dr Rogelia Bogabutcher ask me to call for an appt w/ dr wert for asap. appt Monday October 24th at 1145 dr wert, needs to arrive early to do paperwork by 1130 Pt called given address, ph#, time of appt, dr name, she wrote this info down and repeated back to me

## 2015-10-18 NOTE — Telephone Encounter (Signed)
Thanks. I will call pt this AM and make referral.

## 2015-10-18 NOTE — Telephone Encounter (Signed)
appt made w/ dr wert

## 2015-10-22 ENCOUNTER — Ambulatory Visit (INDEPENDENT_AMBULATORY_CARE_PROVIDER_SITE_OTHER): Payer: Medicare Other | Admitting: Internal Medicine

## 2015-10-22 ENCOUNTER — Encounter: Payer: Self-pay | Admitting: Internal Medicine

## 2015-10-22 VITALS — BP 132/82 | HR 85 | Ht 62.0 in | Wt 150.0 lb

## 2015-10-22 DIAGNOSIS — R339 Retention of urine, unspecified: Secondary | ICD-10-CM | POA: Diagnosis not present

## 2015-10-22 DIAGNOSIS — R05 Cough: Secondary | ICD-10-CM

## 2015-10-22 DIAGNOSIS — R058 Other specified cough: Secondary | ICD-10-CM

## 2015-10-22 NOTE — Progress Notes (Signed)
Subjective:    Patient ID: Katie Barker, female    DOB: 21-Sep-1941    MRN: [id number]  HPI   [id number] yobf quit smoking  In 1976 with no resp problems at all at that point  despite polio age 74 referred to pulmonary clinic 10/22/2015 by Dr Katie Barker with bad cough x 2 m p flu shot lasting sev months around 2012 then bad cough since March 2016 which resolved p rx with bid ppi.    10/22/2015 1st Katie Barker/ Katie Barker   Chief Complaint  Patient presents with  . Pulmonary Consult    Chronic Cough. Pt c/o of chronic wet cough for the past several months with clear mucus. Pt c/o of post-tussive emesis, some wheezing and SOB. Pt states that the cough has improved since last week.  felt like a head/ chest cold at onset March 2016 with persistent sense of nasal congestion worse in am/ cough prodclear mucus in am and worse when lie down and better on ppi bid after a week or 2 and really only here to discuss how to keep the cough coming back again. Noted one point she was coughing when of vomiting but this is all resolved now. She also had associated urinary incontinence with severe cough/ ? Better with saba (not clear from hx)  No obvious asp events /dysphagia or other patterns in day to day or daytime variabilty or assoc sob(unless coughing)  or cp or chest tightness, subjective wheeze overt sinus or hb symptoms. No unusual exp hx or h/o childhood pna/ asthma or knowledge of premature birth.  Sleeping ok without nocturnal  or early am exacerbation  of respiratory  c/o's or need for noct saba. Also denies any obvious fluctuation of symptoms with weather or environmental changes or other aggravating or alleviating factors except as outlined above   Current Medications, Allergies, Complete Past Medical History, Past Surgical History, Family History, and Social History were reviewed in [facility] electronic medical record.             Review of Systems  Constitutional: Negative.   Negative for fever and unexpected weight change.  HENT: Positive for congestion and rhinorrhea. Negative for dental problem, ear pain, nosebleeds, postnasal drip, sinus pressure, sneezing, sore throat and trouble swallowing.   Eyes: Positive for redness and itching.  Respiratory: Positive for cough, shortness of breath and wheezing. Negative for chest tightness.   Cardiovascular: Positive for leg swelling. Negative for palpitations.  Gastrointestinal: Positive for nausea and vomiting.  Endocrine: Negative.   Genitourinary: Negative.  Negative for dysuria.  Musculoskeletal: Positive for joint swelling.  Skin: Positive for color change and rash.  Neurological: Positive for headaches.  Hematological: Does not bruise/bleed easily.  Psychiatric/Behavioral: Negative for dysphoric mood. The patient is nervous/anxious.        Objective:   Physical Exam  W/c bound bf nad  Wt Readings from Last 3 Encounters:  10/22/15 150 lb (68.04 kg)  10/11/15 150 lb 11.2 oz (68.357 kg)  03/29/15 150 lb 3.2 oz (68.13 kg)    Vital signs reviewed   HEENT: nl dentition, turbinates, and orophanx. Nl external ear canals without cough reflex   NECK :  without JVD/Nodes/TM/ nl carotid upstrokes bilaterally   LUNGS: no acc muscle use, clear to A and P bilaterally without cough on insp or exp maneuvers   CV:  RRR  no s3 or murmur or increase in P2, no edema   ABD:  soft and nontender with nl  excursion in the supine position. No bruits or organomegaly, bowel sounds nl  MS:  warm without deformities, calf tenderness, cyanosis or clubbing  SKIN: warm and dry without lesions    NEURO:  alert, approp, w/c bound with paraparesis apparent but not extensively examined / cough mechanics excellent.      I personally reviewed images and agree with radiology impression as follows:  CXR:  10/12/15 Mild left base subsegmental atelectasis and/or scarring. No acute abnormality.      Assessment & Plan:

## 2015-10-22 NOTE — Patient Instructions (Addendum)
GERD (REFLUX)  is an extremely common cause of respiratory symptoms just like yours , many times with no obvious heartburn at all.    It can be treated with medication, but also with lifestyle changes including elevation of the head of your bed (ideally with 6 inch  bed blocks),  Smoking cessation, avoidance of late meals, excessive alcohol, and avoid fatty foods, chocolate, peppermint, colas, red wine, and acidic juices such as orange juice.  NO MINT OR MENTHOL PRODUCTS SO NO COUGH DROPS  USE SUGARLESS CANDY INSTEAD (Jolley ranchers or Stover's or Life Savers) or even ice chips will also do - the key is to swallow to prevent all throat clearing. NO OIL BASED VITAMINS - use powdered substitutes.  If cough not better to your satisfaction you need to return here with a sinus Scan next in the work up but not needed now

## 2015-10-23 ENCOUNTER — Encounter: Payer: Self-pay | Admitting: Internal Medicine

## 2015-10-23 DIAGNOSIS — R05 Cough: Secondary | ICD-10-CM | POA: Insufficient documentation

## 2015-10-23 DIAGNOSIS — R058 Other specified cough: Secondary | ICD-10-CM | POA: Insufficient documentation

## 2015-10-23 NOTE — Assessment & Plan Note (Addendum)
Severe flare 2012 and again March 2016 > resp to max gerd rx as of pulmonary ov 10/22/2015    Classic Upper airway cough syndrome, so named because it's frequently impossible to sort out how much is  CR/sinusitis with freq throat clearing (which can be related to primary GERD)   vs  causing  secondary (" extra esophageal")  GERD from wide swings in gastric pressure that occur with throat clearing, often  promoting self use of mint and menthol lozenges that reduce the lower esophageal sphincter tone and exacerbate the problem further in a cyclical fashion.   These are the same pts (now being labeled as having "irritable larynx syndrome" by some cough centers) who not infrequently have a history of having failed to tolerate ace inhibitors,  dry powder inhalers or biphosphonates or report having atypical reflux symptoms that don't respond to standard doses of PPI , and are easily confused as having aecopd or asthma flares by even experienced allergists/ pulmonologists.   In addition, informed her:  Explained the natural history of uri and why it's necessary in patients at risk to treat GERD aggressively - at least  short term -   to reduce risk of evolving cyclical cough initially  triggered by epithelial injury and a heightened sensitivty to the effects of any upper airway irritants,  most importantly acid - related - then perpetuated by epithelial injury related to the cough itself as the upper airway collapses on itself.  That is, the more sensitive the epithelium becomes once it is damaged by the virus, the more the ensuing irritability> the more the cough, the more the secondary reflux (especially in those prone to reflux) the more the irritation of the sensitive mucosa and so on in a  Classic cyclical pattern.    It is impossible to rule out a component of asthma here as occult reflux can also cause airways to Become reactive and look a lot like asthma. The difference is that most of her coughing occurs  during the day, not while she is sleeping, and apparently received albuterol prior to her most recent office visit with Dr. Rogelia BogaButcher and did not describe it as helpful and has not continued it now that she's not coughing.  I have added a reflux diet to regimen and since she is better recommended that she take the twice a day PPI for a full 3 months before she reduces back to a maintenance dose with a low threshold to double the dose back up to twice a day before meals. If the cough persists despite PPI twice a day the next step would be to do a sinus CT scan and perhaps a methacholine challenge just to follow that if not clear what is causing the cough.  Total time devoted to counseling  = 35/5934m ov review case with pt/ discussion of options/alternatives/ giving and going over instructions (see avs)

## 2015-10-25 ENCOUNTER — Encounter: Payer: Medicare Other | Admitting: Internal Medicine

## 2015-10-25 ENCOUNTER — Ambulatory Visit: Payer: Medicare Other | Admitting: Internal Medicine

## 2015-11-09 ENCOUNTER — Encounter: Payer: Self-pay | Admitting: Internal Medicine

## 2015-11-09 ENCOUNTER — Ambulatory Visit (INDEPENDENT_AMBULATORY_CARE_PROVIDER_SITE_OTHER): Payer: Medicare Other | Admitting: Internal Medicine

## 2015-11-09 VITALS — BP 110/64 | HR 77 | Ht 62.0 in | Wt 146.9 lb

## 2015-11-09 DIAGNOSIS — K219 Gastro-esophageal reflux disease without esophagitis: Secondary | ICD-10-CM | POA: Diagnosis not present

## 2015-11-09 DIAGNOSIS — F419 Anxiety disorder, unspecified: Secondary | ICD-10-CM

## 2015-11-09 DIAGNOSIS — Z0000 Encounter for general adult medical examination without abnormal findings: Secondary | ICD-10-CM

## 2015-11-09 DIAGNOSIS — G14 Postpolio syndrome: Secondary | ICD-10-CM

## 2015-11-09 MED ORDER — DIAZEPAM 2 MG PO TABS: 2.0000 mg | ORAL_TABLET | ORAL | Status: DC | PRN

## 2015-11-09 NOTE — Assessment & Plan Note (Addendum)
We reviewed Dr Thurston HoleWert's A&P. I explained why allergy testing isn't warranted. Cough is about 90% better. She understands to take PPI BID until end of Jan and then go to QD. To call if gets worse.  She states nexium is the only PPI that does not cuase side effects. She states that she has been on 3 before - two caused rash and one caused itching. In EPIC, I have omeprazole and ranitidine document as being tried before. States Nexium not on pref list - will talk to DraperKaye about pre-auth.

## 2015-11-09 NOTE — Assessment & Plan Note (Signed)
Had gone through 10 valium in 2 yrs; then 10 in past 2 months. Uses them for MD appt and grocery shopping. Has had more MD appts recently so went through more and sometimes takes 2 at once. I explained if usage remains high, we would need to talk about preventative tx (SSRI) but for now refill and monitor.

## 2015-11-09 NOTE — Assessment & Plan Note (Signed)
States needs aid for getting in and out of tub; combing hair and getting blouse on if shoulders hurt (not every day), putting on shoes (beding down while sitting is challenging and would need assistance most days), vacuuming, mopping, cooking, washing dishes. She is indep in feeding, transferring, and toileting. I discussed with Edson SnowballShana to see if her ADL needs are enough to qualify for aide.

## 2015-11-09 NOTE — Progress Notes (Signed)
   Subjective:    Patient ID: Katie Barker, female    DOB: 1941-01-01, 74 y.o.   MRN: [id number]  HPI  Katie Barker is here for wheelchair assessment. Please see the A&P for the status of the pt's chronic medical problems.   Review of Systems  Respiratory:       Cough 90% better. Still coughs up a bit of mucous occ  Cardiovascular: Positive for leg swelling.  Musculoskeletal: Positive for arthralgias and gait problem.  Skin: Negative for color change and rash.  Psychiatric/Behavioral: Negative for confusion. The patient is nervous/anxious.        Objective:   Physical Exam  Constitutional: She is oriented to person, place, and time. She appears well-developed and well-nourished.  HENT:  Head: Normocephalic and atraumatic.  Right Ear: External ear normal.  Left Ear: External ear normal.  Nose: Nose normal.  Pulmonary/Chest: Effort normal.  Musculoskeletal: Normal range of motion. She exhibits no edema.  Neurological: She is alert and oriented to person, place, and time.  See A&P  Skin: Skin is warm and dry. She is not diaphoretic.  Psychiatric: She has a normal mood and affect. Her behavior is normal. Judgment and thought content normal.          Assessment & Plan:

## 2015-11-09 NOTE — Assessment & Plan Note (Signed)
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 Her last fall was about one month ago and she doesn't know how or why he fell. She requires use of furniture to help her stand. Her limitations are progressive. Currently, she can walk a short distance if she has her two canes but she is unable to stand / walk for long enough periods of time to do the activities (cooking, cleaning, moving around her home, etc) that she needs to do daily. Previously when she was walking more (with two canes or a walker) she was falling freq. She isn't falling quite as often now that she is using the powerchair. She needs assistance bathing and washing / doing her hair. Ms Katie Barker cannot use a manual chair as she doesn't have the UE strength to move the chair, the hand strength to grip the wheels, and has shoulder pain and decreased ROM. A scooter is not an option as she is already using a power chair and needs the support and stability of a power chair to maintain her sitting balance and to reposition herself while sitting.   MS / neuro exam R leg shorter and uses prosthetic shoe No edema LE B Strength Fist R4, L 4 Elbow flexion L 4, R 4 Hip flexion R 1, L 2 Knee extension R 1, L 2 Knee flexion R 1, L 2 Ankle R 1, L 1 Atrophy of R lower leg, inc but not limited to posterior calf muscles Gait not tested bc she didn't have her 2 canes or her walker and with her degree of muscle weakness I did not feel it prudent to have her walk in an unfamiliar environment.   Ms Katie Barker is alert, cooperative, has demonstrated that she can safely operate a power wheelchair in the home, and has also demonstrated that she is willing and motivated to use the power wheelchair. She reports increased LE edema as the day progresses and her legs remain independent. She did not demonstrate LE edema today bc had only been sitting a couple of hours.  I feel that she would benefit from leg elevation in the powerchair but she wants to ensure that such a chair would fit into her [name].

## 2015-11-09 NOTE — Patient Instructions (Signed)
See me in 6 months I will ask Purnell ShoemakerKaye to phone in Valium I will send in paperwork Friday 11th or Monday the 14th

## 2015-11-12 ENCOUNTER — Encounter: Payer: Self-pay | Admitting: Licensed Clinical Social Worker

## 2015-11-12 NOTE — Progress Notes (Signed)
PCS request discussed with pt's PCP.  Form initiated and placed in PCP's mailbox.

## 2015-11-15 DIAGNOSIS — N319 Neuromuscular dysfunction of bladder, unspecified: Secondary | ICD-10-CM | POA: Diagnosis not present

## 2015-11-15 DIAGNOSIS — R32 Unspecified urinary incontinence: Secondary | ICD-10-CM | POA: Diagnosis not present

## 2015-11-26 ENCOUNTER — Other Ambulatory Visit: Payer: Self-pay | Admitting: Internal Medicine

## 2015-11-27 MED ORDER — ESOMEPRAZOLE MAGNESIUM 40 MG PO CPDR: 40.0000 mg | DELAYED_RELEASE_CAPSULE | Freq: Two times a day (BID) | ORAL | Status: DC

## 2015-12-12 DIAGNOSIS — R32 Unspecified urinary incontinence: Secondary | ICD-10-CM | POA: Diagnosis not present

## 2015-12-12 DIAGNOSIS — N319 Neuromuscular dysfunction of bladder, unspecified: Secondary | ICD-10-CM | POA: Diagnosis not present

## 2015-12-23 ENCOUNTER — Emergency Department (HOSPITAL_COMMUNITY): Payer: Medicare Other

## 2015-12-23 ENCOUNTER — Emergency Department (HOSPITAL_COMMUNITY)
Admission: EM | Admit: 2015-12-23 | Discharge: 2015-12-23 | Disposition: A | Discharge status: Home / Self Care | Payer: Medicare Other | Attending: Emergency Medicine | Admitting: Emergency Medicine

## 2015-12-23 ENCOUNTER — Encounter (HOSPITAL_COMMUNITY): Payer: Self-pay | Admitting: Nurse Practitioner

## 2015-12-23 DIAGNOSIS — R0982 Postnasal drip: Secondary | ICD-10-CM | POA: Insufficient documentation

## 2015-12-23 DIAGNOSIS — E785 Hyperlipidemia, unspecified: Secondary | ICD-10-CM | POA: Diagnosis not present

## 2015-12-23 DIAGNOSIS — M8580 Other specified disorders of bone density and structure, unspecified site: Secondary | ICD-10-CM | POA: Insufficient documentation

## 2015-12-23 DIAGNOSIS — Z8669 Personal history of other diseases of the nervous system and sense organs: Secondary | ICD-10-CM | POA: Insufficient documentation

## 2015-12-23 DIAGNOSIS — Z862 Personal history of diseases of the blood and blood-forming organs and certain disorders involving the immune mechanism: Secondary | ICD-10-CM | POA: Diagnosis not present

## 2015-12-23 DIAGNOSIS — R059 Cough, unspecified: Secondary | ICD-10-CM

## 2015-12-23 DIAGNOSIS — R05 Cough: Secondary | ICD-10-CM

## 2015-12-23 DIAGNOSIS — N3281 Overactive bladder: Secondary | ICD-10-CM | POA: Diagnosis not present

## 2015-12-23 DIAGNOSIS — K219 Gastro-esophageal reflux disease without esophagitis: Secondary | ICD-10-CM | POA: Diagnosis not present

## 2015-12-23 DIAGNOSIS — I10 Essential (primary) hypertension: Secondary | ICD-10-CM | POA: Insufficient documentation

## 2015-12-23 DIAGNOSIS — Z7982 Long term (current) use of aspirin: Secondary | ICD-10-CM | POA: Insufficient documentation

## 2015-12-23 DIAGNOSIS — R0602 Shortness of breath: Secondary | ICD-10-CM | POA: Diagnosis not present

## 2015-12-23 DIAGNOSIS — Z79899 Other long term (current) drug therapy: Secondary | ICD-10-CM | POA: Diagnosis not present

## 2015-12-23 DIAGNOSIS — Z880 Allergy status to penicillin: Secondary | ICD-10-CM | POA: Diagnosis not present

## 2015-12-23 DIAGNOSIS — F419 Anxiety disorder, unspecified: Secondary | ICD-10-CM | POA: Insufficient documentation

## 2015-12-23 DIAGNOSIS — Z87891 Personal history of nicotine dependence: Secondary | ICD-10-CM | POA: Insufficient documentation

## 2015-12-23 MED ORDER — AZITHROMYCIN 250 MG PO TABS
500.0000 mg | ORAL_TABLET | Freq: Once | ORAL | Status: AC
  Administered 2015-12-23: 500 mg via ORAL
  Filled 2015-12-23: qty 2

## 2015-12-23 MED ORDER — AZITHROMYCIN 250 MG PO TABS: 250.0000 mg | ORAL_TABLET | Freq: Every day | ORAL | Status: DC

## 2015-12-23 MED ORDER — HYDROCODONE-HOMATROPINE 5-1.5 MG/5ML PO SYRP
5.0000 mL | ORAL_SOLUTION | Freq: Once | ORAL | Status: AC
  Administered 2015-12-23: 5 mL via ORAL
  Filled 2015-12-23: qty 5

## 2015-12-23 MED ORDER — HYDROCODONE-HOMATROPINE 5-1.5 MG/5ML PO SYRP: 5.0000 mL | ORAL_SOLUTION | Freq: Four times a day (QID) | ORAL | Status: DC | PRN

## 2015-12-23 NOTE — ED Notes (Signed)
Bed: WA17 Expected date:  Expected time:  Means of arrival:  Comments: Cough x two months

## 2015-12-23 NOTE — Discharge Instructions (Signed)

## 2015-12-23 NOTE — ED Provider Notes (Signed)
CSN: [id number]     Arrival date & time 12/23/15  2019 History   First MD Initiated Contact with Patient 12/23/15 2038     Chief Complaint  Patient presents with  . Cough     (Consider location/radiation/quality/duration/timing/severity/associated sxs/prior Treatment) HPI Comments: Patient presents to the emergency department with chief complaint of cough. She states that she has had the cough for the past 2 months. She states today she started coughing and could not catch her breath. She denies any shortness of breath now. She denies any chest pain. She denies any fevers, chills, nausea, or vomiting. There are no aggravating or alleviating factors. Patient has tried OTC medications with no relief.  The history is provided by the patient. No language interpreter was used.    Past Medical History  Diagnosis Date  . Anxiety 09/07/2012    Minimal benzo use. About 2 per month. Not on contract.   Marland Kitchen HTN (hypertension) 09/07/2012    On triple drug (single pill) therapy. ARB, HCTZ, and amlodipine. BP well controlled at home. Elevated in MD's office.   Marland Kitchen GERD (gastroesophageal reflux disease) 09/07/2012    Controlled on PPI PRN.   Marland Kitchen Hyperlipemia 09/07/2012    Her 10 yr CV risk is <5%. LDL goal is < 160. Has been on a statin since about 2003.    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 Anemia, iron deficiency 09/07/2012    Labs from April 2013 include: HgB 9.7 MCV 64 RDW 17 (nl) Ferritin 7 Iron 14 % sat 14 TIBC 454 (high) Stool cards May 2013 negative x 3   . Overactive bladder 09/07/2012    Sees urology yearly. Sxs well controlled on Toviaz.   . Osteopenia 03/17/2014    Bone density 01/2014 : dual femur -2.1, R forearm -0.7   Past Surgical History  Procedure Laterality Date  . Carpal tunnel release      On the L wrist during her late 40's  . Total abdominal hysterectomy w/ bilateral salpingoophorectomy  03/09/2000    Cervix was removed. Reason for TAH BSO was fibroids   History reviewed. No  pertinent family history. Social History  Substance Use Topics  . Smoking status: Former Smoker    Types: Cigarettes    Quit date: 03/08/1975  . Smokeless tobacco: None  . Alcohol Use: None   OB History    No data available     Review of Systems  Constitutional: Negative for fever and chills.  HENT: Positive for postnasal drip. Negative for rhinorrhea, sinus pressure, sneezing and sore throat.   Respiratory: Positive for cough. Negative for shortness of breath.   Cardiovascular: Negative for chest pain.  Gastrointestinal: Negative for nausea, vomiting, abdominal pain, diarrhea and constipation.  Genitourinary: Negative for dysuria.      Allergies  Penicillins; Fluticasone; Omeprazole; Ranitidine; Lipitor; and Pravastatin  Home Medications   Prior to Admission medications   Medication Sig Start Date End Date Taking? Authorizing Provider  aspirin 81 MG tablet Take 81 mg by mouth daily.   Yes Historical Provider, MD  cholecalciferol (VITAMIN D) 1000 UNITS tablet Take 1,000 Units by mouth daily. OTC med.   Yes Historical Provider, MD  diazepam (VALIUM) 2 MG tablet Take 1 tablet (2 mg total) by mouth as needed for anxiety. 11/09/15  Yes Burns Spain, MD  diphenhydrAMINE (SOMINEX) 25 MG tablet Take 25 mg by mouth daily as needed for allergies or sleep.   Yes Historical Provider, MD  esomeprazole (NEXIUM) 40 MG capsule Take  1 capsule (40 mg total) by mouth 2 (two) times daily before a meal. Patient taking differently: Take 40 mg by mouth daily as needed (heartburn).  11/27/15  Yes Burns Spain, MD  ibuprofen (ADVIL,MOTRIN) 200 MG tablet Take 200 mg by mouth every 6 (six) hours as needed for fever or moderate pain.    Yes Historical Provider, MD  TRIBENZOR 20-5-12.5 MG TABS TAKE ONE TABLET BY MOUTH ONCE DAILY 09/14/15  Yes Burns Spain, MD  vitamin E 100 UNIT capsule Take 400 Units by mouth daily. OTC med   Yes Historical Provider, MD  albuterol (PROVENTIL  HFA;VENTOLIN HFA) 108 (90 BASE) MCG/ACT inhaler Inhale 2 puffs into the lungs 2 (two) times daily. Take until your next physician's appt Patient not taking: Reported on 12/23/2015 10/12/15   Burns Spain, MD  hydrocortisone cream 1 % Apply 1 application topically 2 (two) times daily. Patient not taking: Reported on 12/23/2015 08/07/15   Fuller Plan, MD   BP 146/88 mmHg  Pulse 96  Temp(Src) 98.5 F (36.9 C) (Oral)  Resp 20  SpO2 99%  LMP 06/15/1987 Physical Exam  Constitutional: She appears well-developed and well-nourished. No distress.  HENT:  Head: Normocephalic.  Right Ear: External ear normal.  Left Ear: External ear normal.  Mildly erythematous, no tonsillar exudate, no abscess, no stridor, uvula is midline  TMs clear bilaterally  Eyes: Conjunctivae and EOM are normal. Pupils are equal, round, and reactive to light.  Neck: Normal range of motion. Neck supple.  Cardiovascular: Normal rate, regular rhythm and normal heart sounds.  Exam reveals no gallop and no friction rub.   No murmur heard. Pulmonary/Chest: Effort normal and breath sounds normal. No stridor. No respiratory distress. She has no wheezes. She has no rales. She exhibits no tenderness.  CTAB  Abdominal: Soft. Bowel sounds are normal. She exhibits no distension. There is no tenderness.  Musculoskeletal: Normal range of motion. She exhibits no tenderness.  Neurological: She is alert.  Skin: Skin is warm and dry. No rash noted. She is not diaphoretic.  Psychiatric: She has a normal mood and affect. Her behavior is normal. Judgment and thought content normal.  Nursing note and vitals reviewed.   ED Course  Procedures (including critical care time)  Imaging Review Dg Chest 2 View  12/23/2015  CLINICAL DATA:  Coughing for 2 months, shortness of breath beginning last night. History of hypertension, hyperlipidemia, post polio syndrome. EXAM: CHEST  2 VIEW COMPARISON:  Chest radiograph October 11, 2015  FINDINGS: Cardiac silhouette is upper limits of normal in size, mediastinal silhouette is nonsuspicious. Mildly calcified aortic knob. Faint linear density in LEFT lung base persists without pleural effusion or focal consolidation. Pneumothorax. Soft tissue planes and included osseous structures are nonsuspicious. Moderate degenerative change of the thoracic spine. IMPRESSION: Borderline cardiomegaly. Faint LEFT lung base atelectasis/ scarring. Electronically Signed   By: Awilda Metro M.D.   On: 12/23/2015 21:03   I have personally reviewed and evaluated these images and lab results as part of my medical decision-making.    MDM   Final diagnoses:  Cough    Pt CXR negative for acute infiltrate. Patients symptoms are consistent with URI, likely viral etiology, but given that patient has been coughing for 2 months will give abx trial. Pt will be discharged with symptomatic treatment.  Verbalizes understanding and is agreeable with plan. Pt is hemodynamically stable & in NAD prior to dc.     Roxy Horseman, PA-C 12/23/15 Ouida Sills  Lebron Conners  Donnald GarrePfeiffer, MD 01/05/16 [phone]

## 2015-12-23 NOTE — ED Notes (Signed)
Pt states she has had a cough for the last 2 months that worsened today while she was walking, states she could not catch her breath but denies shOB at this time. Denies any other symptoms.

## 2016-01-07 ENCOUNTER — Other Ambulatory Visit: Payer: Self-pay | Admitting: Internal Medicine

## 2016-01-07 NOTE — Telephone Encounter (Signed)
Called pharm, pt has refills thru 08/2016, they will get it ready, called pt, informed her

## 2016-01-07 NOTE — Telephone Encounter (Signed)
Pt requesting Tribenzor to be filled @ [facility].

## 2016-01-21 DIAGNOSIS — N319 Neuromuscular dysfunction of bladder, unspecified: Secondary | ICD-10-CM | POA: Diagnosis not present

## 2016-01-21 DIAGNOSIS — R32 Unspecified urinary incontinence: Secondary | ICD-10-CM | POA: Diagnosis not present

## 2016-01-23 ENCOUNTER — Telehealth: Payer: Self-pay | Admitting: Internal Medicine

## 2016-01-23 NOTE — Telephone Encounter (Signed)
Patient is calling stating that the pharmacy states she doesn't have refills for TRIBENZOR 20-5-12.5 MG TABS and needs to call the office to get them. According to previous phone call on 01/07/16 it states that they do.

## 2016-01-23 NOTE — Telephone Encounter (Signed)
Called pharm, she does have refills there on file, it will be gotten ready and they will call pt

## 2016-01-28 DIAGNOSIS — G14 Postpolio syndrome: Secondary | ICD-10-CM | POA: Diagnosis not present

## 2016-01-28 DIAGNOSIS — R609 Edema, unspecified: Secondary | ICD-10-CM | POA: Diagnosis not present

## 2016-02-14 DIAGNOSIS — R32 Unspecified urinary incontinence: Secondary | ICD-10-CM | POA: Diagnosis not present

## 2016-02-14 DIAGNOSIS — N319 Neuromuscular dysfunction of bladder, unspecified: Secondary | ICD-10-CM | POA: Diagnosis not present

## 2016-02-26 DIAGNOSIS — G14 Postpolio syndrome: Secondary | ICD-10-CM | POA: Diagnosis not present

## 2016-02-26 DIAGNOSIS — R609 Edema, unspecified: Secondary | ICD-10-CM | POA: Diagnosis not present

## 2016-03-05 DIAGNOSIS — R32 Unspecified urinary incontinence: Secondary | ICD-10-CM | POA: Diagnosis not present

## 2016-03-05 DIAGNOSIS — N319 Neuromuscular dysfunction of bladder, unspecified: Secondary | ICD-10-CM | POA: Diagnosis not present

## 2016-03-25 DIAGNOSIS — R609 Edema, unspecified: Secondary | ICD-10-CM | POA: Diagnosis not present

## 2016-03-25 DIAGNOSIS — G14 Postpolio syndrome: Secondary | ICD-10-CM | POA: Diagnosis not present

## 2016-03-28 ENCOUNTER — Other Ambulatory Visit: Payer: Self-pay | Admitting: Internal Medicine

## 2016-03-31 NOTE — Telephone Encounter (Signed)
rx phoned in

## 2016-03-31 NOTE — Telephone Encounter (Signed)
Pls phone in valium

## 2016-04-02 DIAGNOSIS — R32 Unspecified urinary incontinence: Secondary | ICD-10-CM | POA: Diagnosis not present

## 2016-04-02 DIAGNOSIS — N319 Neuromuscular dysfunction of bladder, unspecified: Secondary | ICD-10-CM | POA: Diagnosis not present

## 2016-04-22 ENCOUNTER — Ambulatory Visit: Payer: Medicare Other | Admitting: Internal Medicine

## 2016-04-24 ENCOUNTER — Encounter: Payer: Self-pay | Admitting: Internal Medicine

## 2016-04-24 ENCOUNTER — Ambulatory Visit (INDEPENDENT_AMBULATORY_CARE_PROVIDER_SITE_OTHER): Payer: Medicare Other | Admitting: Internal Medicine

## 2016-04-24 VITALS — BP 139/77 | HR 85 | Temp 98.5°F | Ht 62.0 in | Wt 152.6 lb

## 2016-04-24 DIAGNOSIS — R21 Rash and other nonspecific skin eruption: Secondary | ICD-10-CM | POA: Diagnosis not present

## 2016-04-24 DIAGNOSIS — E785 Hyperlipidemia, unspecified: Secondary | ICD-10-CM

## 2016-04-24 DIAGNOSIS — D509 Iron deficiency anemia, unspecified: Secondary | ICD-10-CM

## 2016-04-24 DIAGNOSIS — I10 Essential (primary) hypertension: Secondary | ICD-10-CM

## 2016-04-24 DIAGNOSIS — Z79899 Other long term (current) drug therapy: Secondary | ICD-10-CM

## 2016-04-24 NOTE — Assessment & Plan Note (Signed)
Pt reports having a pruritic ash for the past couple of years mainly confined to there forearms that comes and goes.  She has tried new detergent, soap, stopping medications but nothing has made a difference.  She states a hydrocortisone cream helps somewhat.  She also reports an incident several years ago where she was hospitalized for tongue swelling.  She is requesting a referral to an allergist.  I discussed that they may not be able to find an answer to the etiology of her rash since only common allergens are tested.  She understands this but would like to proceed.  It appears to me that she likely has a contact dermatitis given the distribution of her rash.  Unlikely to be medication related given that the rash is confined to the forearms.  She is in a power wheelchair and maybe she is allergic to a material in the arm rest?   -referral to Dr. Willa RoughHicks per pt request -cont current mgmt

## 2016-04-24 NOTE — Progress Notes (Signed)
Patient ID: Katie Barker, female   DOB: May 19, 1941, 75 y.o.   MRN: [id number]     Subjective:   Patient ID: Katie Barker female    DOB: May 19, 1941 75 y.o.    MRN: [id number] Health Maintenance Due: Health Maintenance Due  Topic Date Due  . TETANUS/TDAP  02/19/1962  . MAMMOGRAM  02/19/1993  . ZOSTAVAX  02/19/2003  . PNA vac Low Risk Adult (1 of 2 - PCV13) 02/20/2008    _________________________________________________  HPI: Ms.Katie Barker is a 75 y.o. female here for an acute visit.  Pt has a PMH outlined below.  Please see problem-based charting assessment and plan for further status of patient's chronic medical problems addressed at today's visit.  PMH: Past Medical History  Diagnosis Date  . Anxiety 09/07/2012    Minimal benzo use. About 2 per month. Not on contract.   Marland Kitchen. HTN (hypertension) 09/07/2012    On triple drug (single pill) therapy. ARB, HCTZ, and amlodipine. BP well controlled at home. Elevated in MD's office.   Marland Kitchen. GERD (gastroesophageal reflux disease) 09/07/2012    Controlled on PPI PRN.   Marland Kitchen. Hyperlipemia 09/07/2012    Her 10 yr CV risk is <5%. LDL goal is < 160. Has been on a statin since about 2003.    Marland Kitchen. 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 Anemia, iron deficiency 09/07/2012    Labs from April 2013 include: HgB 9.7 MCV 64 RDW 17 (nl) Ferritin 7 Iron 14 % sat 14 TIBC 454 (high) Stool cards May 2013 negative x 3   . Overactive bladder 09/07/2012    Sees urology yearly. Sxs well controlled on Toviaz.   . Osteopenia 03/17/2014    Bone density 01/2014 : dual femur -2.1, R forearm -0.7    Medications: Current Outpatient Prescriptions on File Prior to Visit  Medication Sig Dispense Refill  . albuterol (PROVENTIL HFA;VENTOLIN HFA) 108 (90 BASE) MCG/ACT inhaler Inhale 2 puffs into the lungs 2 (two) times daily. Take until your next physician's appt (Patient not taking: Reported on 12/23/2015) 1 Inhaler 0  . aspirin 81 MG tablet Take 81 mg by mouth daily.    .  diazepam (VALIUM) 2 MG tablet TAKE ONE TABLET BY MOUTH AS NEEDED FOR ANXIETY 10 tablet 0  . diphenhydrAMINE (SOMINEX) 25 MG tablet Take 25 mg by mouth daily as needed for allergies or sleep.    Marland Kitchen. esomeprazole (NEXIUM) 40 MG capsule Take 1 capsule (40 mg total) by mouth 2 (two) times daily before a meal. (Patient taking differently: Take 40 mg by mouth daily as needed (heartburn). ) 60 capsule 5  . HYDROcodone-homatropine (HYCODAN) 5-1.5 MG/5ML syrup Take 5 mLs by mouth every 6 (six) hours as needed for cough. 120 mL 0  . hydrocortisone cream 1 % Apply 1 application topically 2 (two) times daily. (Patient not taking: Reported on 12/23/2015) 30 g 0  . ibuprofen (ADVIL,MOTRIN) 200 MG tablet Take 200 mg by mouth every 6 (six) hours as needed for fever or moderate pain.     Marland Kitchen. TRIBENZOR 20-5-12.5 MG TABS TAKE ONE TABLET BY MOUTH ONCE DAILY 30 tablet 11  . vitamin E 100 UNIT capsule Take 400 Units by mouth daily. OTC med     No current facility-administered medications on file prior to visit.    Allergies: Allergies  Allergen Reactions  . Penicillins Other (See Comments)    Per pt took once and had syncope, itch, and was hospitalized.  Has patient had a PCN reaction causing immediate  rash, facial/tongue/throat swelling, SOB or lightheadedness with hypotension: yes Has patient had a PCN reaction causing severe rash involving mucus membranes or skin necrosis: unknown Has patient had a PCN reaction that required hospitalization yes Has patient had a PCN reaction occurring within the last 10 years: no If all of the above answers are "NO", then may proceed with Cephalospor  . Fluticasone Other (See Comments)    Burned nose and eyes.  . Omeprazole Nausea Only    "makes me sick to my stomach"  . Ranitidine Nausea Only    "make me sick on my stomach"  . Lipitor [Atorvastatin] Other (See Comments)    HA  . Pravastatin Itching    FH: No family history on file.  SH: Social History   Social  History  . Marital Status: Divorced    Spouse Name: N/A  . Number of Children: N/A  . Years of Education: N/A   Social History Main Topics  . Smoking status: Former Smoker    Types: Cigarettes    Quit date: 03/08/1975  . Smokeless tobacco: None  . Alcohol Use: None  . Drug Use: None  . Sexual Activity: Not Asked   Other Topics Concern  . None   Social History Narrative   Lives alone in an apartment. Independent in ADL's. Drives. Has motorized WC for long distances. Otherwise, uses cane. On disability for post-polio syndrome. Has mail delivered to door. Has permanent handicapped plate.     Review of Systems: Constitutional: Negative for fever, chills.  Eyes: Negative for blurred vision.  Respiratory: Negative for cough and shortness of breath.  Cardiovascular: Negative for chest pain.  Gastrointestinal: Negative for nausea, vomiting. Neurological: +occasional dizziness.   Objective:   Vital Signs: Filed Vitals:   04/24/16 1323 04/24/16 1324  BP:  139/77  Pulse:  85  Temp: 98.5 F (36.9 C)   TempSrc: Oral   Height:  (1.575 m)   Weight: 152 lb 9.6 oz (69.219 kg)   SpO2:  99%      BP Readings from Last 3 Encounters:  04/24/16 139/77  12/23/15 148/94  11/09/15 110/64    Physical Exam: Constitutional: Vital signs reviewed.  Patient is in NAD and cooperative with exam.  Head: Normocephalic and atraumatic. Eyes: EOMI, conjunctivae nl, no scleral icterus.  Neck: Supple. Cardiovascular: RRR, no MRG. Pulmonary/Chest: normal effort, CTAB, no wheezes, rales, or rhonchi. Abdominal: Soft. NT/ND +BS. Neurological: A&O x3, cranial nerves II-XII are grossly intact. Skin: Warm, dry and intact. Rash on forearms b/l.    Assessment & Plan:   Assessment and plan was discussed and formulated with my attending.

## 2016-04-24 NOTE — Assessment & Plan Note (Signed)
Pt has been taking ferrous sulfate OTC once daily and would like labs today. -ferritin

## 2016-04-24 NOTE — Assessment & Plan Note (Signed)
BP today 139/77. -cont current meds

## 2016-04-24 NOTE — Assessment & Plan Note (Signed)
Pt not currently on a statin.  -lipid panel per pt request

## 2016-04-24 NOTE — Patient Instructions (Signed)
Thank you for your visit today.   Please return to the internal medicine clinic in about 6 months or sooner if needed.     I have made the following additions/changes to your medications:  We will check your labs today and will send you a letter with the results.  If you earwax is bothersome you may try some over the counter debrox.   I have made the following referrals for you: Dr. Willa RoughHicks.   Please be sure to bring all of your medications with you to every visit; this includes herbal supplements, vitamins, eye drops, and any over-the-counter medications.   Should you have any questions regarding your medications and/or any new or worsening symptoms, please be sure to call the clinic at [phone].   If you believe that you are suffering from a life threatening condition or one that may result in the loss of limb or function, then you should call 911 and proceed to the nearest Emergency Department.   A healthy lifestyle and preventative care can promote health and wellness.   Maintain regular health, dental, and eye exams.  Eat a healthy diet. Foods like vegetables, fruits, whole grains, low-fat dairy products, and lean protein foods contain the nutrients you need without too many calories. Decrease your intake of foods high in solid fats, added sugars, and salt. Get information about a proper diet from your caregiver, if necessary.  Regular physical exercise is one of the most important things you can do for your health. Most adults should get at least 150 minutes of moderate-intensity exercise (any activity that increases your heart rate and causes you to sweat) each week. In addition, most adults need muscle-strengthening exercises on 2 or more days a week.   Maintain a healthy weight. The body mass index (BMI) is a screening tool to identify possible weight problems. It provides an estimate of body fat based on height and weight. Your caregiver can help determine your BMI, and can  help you achieve or maintain a healthy weight. For adults 20 years and older:  A BMI below 18.5 is considered underweight.  A BMI of 18.5 to 24.9 is normal.  A BMI of 25 to 29.9 is considered overweight.  A BMI of 30 and above is considered obese.

## 2016-04-25 ENCOUNTER — Encounter: Payer: Self-pay | Admitting: Internal Medicine

## 2016-04-25 DIAGNOSIS — R609 Edema, unspecified: Secondary | ICD-10-CM | POA: Diagnosis not present

## 2016-04-25 DIAGNOSIS — G14 Postpolio syndrome: Secondary | ICD-10-CM | POA: Diagnosis not present

## 2016-04-25 LAB — CBC
HEMOGLOBIN: 8.8 g/dL — AB (ref 11.1–15.9)
Hematocrit: 30.4 % — ABNORMAL LOW (ref 34.0–46.6)
MCH: 19.3 pg — ABNORMAL LOW (ref 26.6–33.0)
MCHC: 28.9 g/dL — AB (ref 31.5–35.7)
MCV: 67 fL — ABNORMAL LOW (ref 79–97)
Platelets: 422 10*3/uL — ABNORMAL HIGH (ref 150–379)
RBC: 4.56 x10E6/uL (ref 3.77–5.28)
RDW: 18.7 % — ABNORMAL HIGH (ref 12.3–15.4)
WBC: 4.3 10*3/uL (ref 3.4–10.8)

## 2016-04-25 LAB — LIPID PANEL
Chol/HDL Ratio: 2.8 ratio units (ref 0.0–4.4)
Cholesterol, Total: 232 mg/dL — ABNORMAL HIGH (ref 100–199)
HDL: 84 mg/dL (ref >39)
LDL Calculated: 134 mg/dL — ABNORMAL HIGH (ref 0–99)
Triglycerides: 68 mg/dL (ref 0–149)
VLDL Cholesterol Cal: 14 mg/dL (ref 5–40)

## 2016-04-25 LAB — FERRITIN: Ferritin: 7 ng/mL — ABNORMAL LOW (ref 15–150)

## 2016-04-27 NOTE — Progress Notes (Signed)
Case discussed with Dr. Dr. Delane GingerGill soon after the resident saw the patient.  We reviewed the resident's history and exam and pertinent patient test results.  I agree with the assessment, diagnosis, and plan of care documented in the resident's note.

## 2016-04-28 DIAGNOSIS — N319 Neuromuscular dysfunction of bladder, unspecified: Secondary | ICD-10-CM | POA: Diagnosis not present

## 2016-04-28 DIAGNOSIS — R32 Unspecified urinary incontinence: Secondary | ICD-10-CM | POA: Diagnosis not present

## 2016-05-23 ENCOUNTER — Ambulatory Visit: Payer: Self-pay | Admitting: Allergy and Immunology

## 2016-05-25 DIAGNOSIS — R609 Edema, unspecified: Secondary | ICD-10-CM | POA: Diagnosis not present

## 2016-05-25 DIAGNOSIS — G14 Postpolio syndrome: Secondary | ICD-10-CM | POA: Diagnosis not present

## 2016-06-04 DIAGNOSIS — R32 Unspecified urinary incontinence: Secondary | ICD-10-CM | POA: Diagnosis not present

## 2016-06-04 DIAGNOSIS — N319 Neuromuscular dysfunction of bladder, unspecified: Secondary | ICD-10-CM | POA: Diagnosis not present

## 2016-06-06 ENCOUNTER — Ambulatory Visit (INDEPENDENT_AMBULATORY_CARE_PROVIDER_SITE_OTHER): Payer: Medicare Other | Admitting: Allergy and Immunology

## 2016-06-06 ENCOUNTER — Encounter: Payer: Self-pay | Admitting: Allergy and Immunology

## 2016-06-06 VITALS — BP 120/82 | HR 84 | Temp 98.5°F | Resp 18 | Ht 61.81 in | Wt 152.6 lb

## 2016-06-06 DIAGNOSIS — H1010 Acute atopic conjunctivitis, unspecified eye: Secondary | ICD-10-CM | POA: Diagnosis not present

## 2016-06-06 DIAGNOSIS — L299 Pruritus, unspecified: Secondary | ICD-10-CM | POA: Diagnosis not present

## 2016-06-06 DIAGNOSIS — R220 Localized swelling, mass and lump, head: Secondary | ICD-10-CM

## 2016-06-06 DIAGNOSIS — J309 Allergic rhinitis, unspecified: Secondary | ICD-10-CM

## 2016-06-06 DIAGNOSIS — R21 Rash and other nonspecific skin eruption: Secondary | ICD-10-CM | POA: Diagnosis not present

## 2016-06-06 DIAGNOSIS — K130 Diseases of lips: Secondary | ICD-10-CM

## 2016-06-06 MED ORDER — EPINEPHRINE 0.3 MG/0.3ML IJ SOAJ: 0.3000 mg | Freq: Once | INTRAMUSCULAR | Status: AC

## 2016-06-06 NOTE — Progress Notes (Signed)
NEW PATIENT NOTE  RE: Katie Barker MRN: [id number] DOB: March 26, 1941 [facility] [place] [address] [place] [address] Date of Office Visit: 06/06/2016  Dear Burns Spain, MD:  I had the pleasure of seeing Katie Barker today in initial evaluation, as you recall-- Subjective:  Katie Barker is a 75 y.o. female who presents today for Rash and Rhinitis  Assessment:   1. Pruritic Rash. (2015)  2. Reported lip and tongue swelling episodes, unclear etiology  (2014).    3. Allergic rhinoconjunctivitis, significant seasonal and perennial hypersensitivities.    4. Negative selected food testing.  5.      Former smoker with reported episode of bronchitis with albuterol use in 2016. 6.      Patient report of itching/hives associated with penicillin administration. 7.      Previous report of itching with fluticasone--now discontinued. Plan:   Meds ordered this encounter  Medications  . EPINEPHrine (EPIPEN 2-PAK) 0.3 mg/0.3 mL IJ SOAJ injection    Sig: Inject 0.3 mLs (0.3 mg total) into the muscle once.    Dispense:  2 Device    Refill:  2  1. Avoidance: Mite, Mold and Pollen   2. Antihistamine: Claritin  by mouth once daily for runny nose or itching. 3. Nasal Spray:  Rhinocort AQ 1-2 spray(s) each nostril once daily for stuffy nose or drainage.  4.  Moisturize skin with Vanicream or Cerave  2-4 times daily.   Triamcinolone cream 0.1% once daily to rash area at arms and chest (do not apply to face). 5. Keep written diary of any further swelling episodes/itching/skin changes with environment/exposure/ingestion/activity. 6. Other: Obtain labs at [facility]--specific IgE.                  Epi-pen/Benadryl as needed. 7. Nasal Saline wash each evening at shower time. 8. Follow up Visit:  One month or sooner if needed.  HPI: Katie Barker presents to the office accompanied by her daughter reporting mild intermittent rhinorrhea, congestion,  sneezing, itchy watery eyes, postnasal drip which may be provoked by pollen, animal dander, outdoor, strong odor and perfumed exposures in the very recent years.  In addition since 2015, she describes episodes of redness which are slightly pruritic and irritated, possibly a hive component, usually at her arm and chest area.  She discontinued all of her maintenance medications and noted very little change.  She reports decreased itching with steroid cream but not resolution.  In addition, there have been several episodes of tongue fullness?possible swelling.  The last in April 2017 appeared to involve the upper lip an hour after ingesting [employer] hamburger.  She administer Benadryl and had improvement.  She is a beef on various occasions since without difficulty There was no associated diarrhea, vomiting, dysphagia, shortness breath, difficulty breathing, generalized hives or swelling.  She believes there may have been other swelling episodes including possible episode after ingesting cabbage, fish and cornbread meal.  She did not make any modifications to her diet and is unsure of any specific provoking factor.  No history of tick bites and reports a varied diet.  Denies ED or Urgent care visits, prednisone or antibiotic courses.  Medical History: Past Medical History  Diagnosis Date  . Anxiety 09/07/2012    Minimal benzo use. About 2 per month. Not on contract.   Marland Kitchen HTN (hypertension) 09/07/2012    On triple drug (single pill) therapy. ARB, HCTZ, and amlodipine. BP well controlled at home. Elevated in  MD's office.   Marland Kitchen GERD (gastroesophageal reflux disease) 09/07/2012    Controlled on PPI PRN.   Marland Kitchen Hyperlipemia 09/07/2012    Her 10 yr CV risk is <5%. LDL goal is < 160. Has been on a statin since about 2003.    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 Anemia, iron deficiency 09/07/2012    Labs from April 2013 include: HgB 9.7 MCV 64 RDW 17 (nl) Ferritin 7 Iron 14 % sat 14 TIBC 454 (high) Stool cards May 2013  negative x 3   . Overactive bladder 09/07/2012    Sees urology yearly. Sxs well controlled on Toviaz.   . Osteopenia 03/17/2014    Bone density 01/2014 : dual femur -2.1, R forearm -0.7   Surgical History: Past Surgical History  Procedure Laterality Date  . Carpal tunnel release      On the L wrist during her late 40's  . Total abdominal hysterectomy w/ bilateral salpingoophorectomy  03/09/2000    Cervix was removed. Reason for TAH BSO was fibroids   Family History: Family History  Problem Relation Age of Onset  . Angioedema Neg Hx   . Eczema Neg Hx   . Immunodeficiency Neg Hx   . Urticaria Neg Hx   . Asthma Daughter   . Allergic rhinitis Daughter    Social History: Social History  . Marital Status: Divorced    Spouse Name: N/A  . Number of Children: 3  . Years of Education: N/A   Social History Main Topics  . Smoking status: Former Smoker    Types: Cigarettes    Quit date: 03/08/1975  . Smokeless tobacco: Not on file  . Alcohol Use: Not on file  . Drug Use: Not on file  . Sexual Activity: Not on file   Social History Narrative   Lives alone in an apartment. Independent in ADL's. Drives. Has motorized WC for long distances. Otherwise, uses cane. On disability for post-polio syndrome. Has mail delivered to door. Has permanent handicapped plate.    Katie Barker has a current medication list which includes the following prescription(s): aspirin, diazepam, esomeprazole, tribenzor, vitamin e.  Drug Allergies: Allergies  Allergen Reactions  . Penicillins Other (See Comments)  . Omeprazole Nausea Only    "makes me sick to my stomach"/Itching  . Ranitidine Nausea Only    "make me sick on my stomach"/Itching  . Lipitor [Atorvastatin] Other (See Comments)    HA  . Pravastatin Itching   Environmental History: Katie Barker lives in an older apartment with carpet floors, with central heat and air; stuffed mattress, non-feather pillow/comforter without humidifier, pets and smokers.    Review of Systems  Constitutional: Negative for fever, weight loss and malaise/fatigue.  HENT: Positive for congestion. Negative for ear pain, hearing loss, nosebleeds and sore throat.   Eyes: Negative for discharge and redness.       Corrective eyeglasses lenses for 20 years.  Respiratory: Negative for cough, shortness of breath and wheezing.        Denies history of pneumonia.  Bronchitis twice.  Gastrointestinal: Negative for heartburn, nausea, vomiting, abdominal pain, diarrhea and constipation.  Genitourinary: Negative.   Musculoskeletal: Negative for myalgias and joint pain.  Skin: Positive for itching and rash.  Neurological: Negative.  Negative for dizziness, seizures, weakness and headaches.  Endo/Heme/Allergies: Positive for environmental allergies.       Denies sensitivity to aspirin, NSAIDs, stinging insects, foods, latex, jewelry and cosmetics.  Immunological: No chronic or recurring infections. Objective:   Filed Vitals:  06/06/16 0935  BP: 120/82  Pulse: 84  Temp: 98.5 F (36.9 C)  Resp: 18   SpO2 Readings from Last 1 Encounters:  06/06/16 98%   Physical Exam  Constitutional: She is well-developed, well-nourished, and in no distress.  HENT:  Head: Atraumatic.  Right Ear: Tympanic membrane and ear canal normal.  Left Ear: Tympanic membrane and ear canal normal.  Nose: Mucosal edema present. No rhinorrhea. No epistaxis.  Mouth/Throat: Oropharynx is clear and moist and mucous membranes are normal. No oropharyngeal exudate, posterior oropharyngeal edema or posterior oropharyngeal erythema.  Eyes: Conjunctivae are normal.  Neck: Neck supple.  Cardiovascular: Normal rate, S1 normal and S2 normal.   No murmur heard. Pulmonary/Chest: Effort normal. She has no wheezes. She has no rhonchi. She has no rales.  Abdominal: Soft. Normal appearance and bowel sounds are normal.  Musculoskeletal: She exhibits no edema.  Lymphadenopathy:    She has no cervical adenopathy.   Neurological: She is alert.  Skin: Skin is warm and intact. Rash (patchy minimally dry/erythematous scattered lesions at right lower arm and single lesion at right upper chest without dermatographism.) noted. No cyanosis. Nails show no clubbing.   Diagnostics:  Skin testing:  Very strong reactivity to multiple grass and tree pollens, mild reactivity to selected weed pollens, dust mite, cat hair, dog epithelial, and selected mold species with essentially negative selective food testing.    Katie M. Willa RoughHicks, MD   cc: Blanch MediaBUTCHER,ELIZABETH, MD

## 2016-06-06 NOTE — Patient Instructions (Addendum)
Take Home Sheet  1. Avoidance: Mite, Mold and Pollen     2. Antihistamine: Claritin 10mg  by mouth once daily for runny nose or itching.   3. Nasal Spray:  Rhinocort AQ 1-2 spray(s) each nostril once daily for stuffy nose or drainage.    4.  Moisturize skin with Vanicream or Cerave  2-4 times daily.   Triamcinolone cream 0.1% once daily to rash area at arms and chest (do not apply to face).  5. Keep written diary of any further swelling episodes/itching/skin changes of environment/exposure/ingestion/activity.   6. Other: [facility]                  Epi-pen/Benadryl as needed.   7. Nasal Saline wash each evening at shower time.   8. Follow up Visit:  One month or sooner if needed.   Websites that have reliable Patient information: 1. American Academy of Asthma, Allergy, & Immunology: www.aaaai.org 2. Food Allergy Network: www.foodallergy.org 3. Mothers of Asthmatics: www.aanma.org 4. [facility]: https://www.strong.com/www.njc.org 5. American College of Allergy, Asthma, & Immunology: BiggerRewards.iswww.allergy.mcg.edu or www.acaai.org  Control of House Dust Mite Allergen  House dust mites play a major role in allergic asthma and rhinitis.  They occur in environments with high humidity wherever human skin, the food for dust mites is found. High levels have been detected in dust obtained from mattresses, pillows, carpets, upholstered furniture, bed covers, clothes and soft toys.  The principal allergen of the house dust mite is found in its feces.  A gram of dust may contain 1,000 mites and 250,000 fecal particles.  Mite antigen is easily measured in the air during house cleaning activities.  1. Encase mattresses, including the box spring, and pillow, in an air tight cover.  Seal the zipper end of the encased mattresses with wide adhesive tape. 2. Wash the bedding in water of 130 degrees Farenheit weekly.  Avoid cotton comforters/quilts and flannel bedding: the most ideal  bed covering is the dacron comforter. 3. Remove all upholstered furniture from the bedroom. 4. Remove carpets, carpet padding, rugs, and non-washable window drapes from the bedroom.  Wash drapes weekly or use plastic window coverings. 5. Remove all non-washable stuffed toys from the bedroom.  Wash stuffed toys weekly. 6. Have the room cleaned frequently with a vacuum cleaner and a damp dust-mop.  The patient should not be in a room which is being cleaned and should wait 1 hour after cleaning before going into the room. 7. Close and seal all heating outlets in the bedroom.  Otherwise, the room will become filled with dust-laden air.  An electric heater can be used to heat the room. 8. Reduce indoor humidity to less than 50%.  Do not use a humidifier.  Reducing Pollen Exposure  The American Academy of Allergy, Asthma and Immunology suggests the following steps to reduce your exposure to pollen during allergy seasons.  9. Do not hang sheets or clothing out to dry; pollen may collect on these items. 10. Do not mow lawns or spend time around freshly cut grass; mowing stirs up pollen. 11. Keep windows closed at night.  Keep car windows closed while driving. 12. Minimize morning activities outdoors, a time when pollen counts are usually at their highest. 13. Stay indoors as much as possible when pollen counts or humidity is high and on windy days when pollen tends to remain in the air longer. 14. Use air conditioning when possible.  Many air conditioners have filters that  trap the pollen spores. 15. Use a HEPA room air filter to remove pollen form the indoor air you breathe.  Control of Mold Allergen  Mold and fungi can grow on a variety of surfaces provided certain temperature and moisture conditions exist.  Outdoor molds grow on plants, decaying vegetation and soil.  The major outdoor mold, Alternaria dn Cladosporium, are found in very high numbers during hot and dry conditions.  Generally, a late  Summer - Fall peak is seen for common outdoor fungal spores.  Rain will temporarily lower outdoor mold spore count, but counts rise rapidly when the rainy period ends.  The most important indoor molds are Aspergillus and Penicillium.  Dark, humid and poorly ventilated basements are ideal sites for mold growth.  The next most common sites of mold growth are the bathroom and the kitchen.  Outdoor [employer] 1. Use air conditioning and keep windows closed 2. Avoid exposure to decaying vegetation. 3. Avoid leaf raking. 4. Avoid grain handling. 5. Consider wearing a face mask if working in moldy areas.  Indoor Mold Control 1. Maintain humidity below 50%. 2. Clean washable surfaces with 5% bleach solution. 3. Remove sources e.g. Contaminated carpets.  Control of Cockroach Allergen  Cockroach allergen has been identified as an important cause of acute attacks of asthma, especially in urban settings.  There are fifty-five species of cockroach that exist in the [place], however only three, the [place], [place] species produce allergen that can affect patients with Asthma.  Allergens can be obtained from fecal particles, egg casings and secretions from cockroaches.  1. Remove food sources. 2. Reduce access to water. 3. Seal access and entry points. 4. Spray runways with 0.5-1% Diazinon or Chlorpyrifos 5. Blow boric acid power under stoves and refrigerator. 6. Place bait stations (hydramethylnon) at feeding sites.

## 2016-06-12 ENCOUNTER — Encounter: Payer: Medicare Other | Admitting: Internal Medicine

## 2016-06-12 ENCOUNTER — Ambulatory Visit: Payer: Medicare Other | Admitting: Internal Medicine

## 2016-06-12 DIAGNOSIS — N319 Neuromuscular dysfunction of bladder, unspecified: Secondary | ICD-10-CM | POA: Diagnosis not present

## 2016-06-12 DIAGNOSIS — N819 Female genital prolapse, unspecified: Secondary | ICD-10-CM | POA: Diagnosis not present

## 2016-06-12 DIAGNOSIS — N3941 Urge incontinence: Secondary | ICD-10-CM | POA: Diagnosis not present

## 2016-06-12 DIAGNOSIS — R300 Dysuria: Secondary | ICD-10-CM | POA: Diagnosis not present

## 2016-06-12 DIAGNOSIS — N393 Stress incontinence (female) (male): Secondary | ICD-10-CM | POA: Diagnosis not present

## 2016-06-18 ENCOUNTER — Encounter: Payer: Self-pay | Admitting: Internal Medicine

## 2016-06-18 ENCOUNTER — Ambulatory Visit (INDEPENDENT_AMBULATORY_CARE_PROVIDER_SITE_OTHER): Payer: Medicare Other | Admitting: Internal Medicine

## 2016-06-18 VITALS — BP 122/80 | HR 76 | Temp 99.0°F | Ht 61.81 in | Wt 145.4 lb

## 2016-06-18 DIAGNOSIS — F419 Anxiety disorder, unspecified: Secondary | ICD-10-CM | POA: Diagnosis not present

## 2016-06-18 DIAGNOSIS — D509 Iron deficiency anemia, unspecified: Secondary | ICD-10-CM | POA: Diagnosis not present

## 2016-06-18 MED ORDER — DIAZEPAM 2 MG PO TABS: 2.0000 mg | ORAL_TABLET | ORAL | Status: DC | PRN

## 2016-06-18 NOTE — Assessment & Plan Note (Addendum)
Last hgb 8.8, ferritin was 7 last time 2 months ago. Takes iron 5 mg x2 tabs (unknown form of iron).  Discussed the need to do IV Iron transfusion to help her with iron def since she has not responded in oral trial for several months. Patient is nervous about IV transfusion and wants to stick with oral for now.  -will recheck CBC today

## 2016-06-18 NOTE — Assessment & Plan Note (Signed)
Asking for valium referral. Was taking it only before doctor visits but recently also takes it before going to the grocery store. Was taking 10 tabs in 1 year in the past but recent use has been more frequent.  Discussed with patient the risk of taking benzos. Asked her to only take it before doctor visits if needed -will refill valium 2mg  #10 tabs.

## 2016-06-18 NOTE — Patient Instructions (Signed)
We will check your blood count today.   Please consider IV iron therapy as oral tablet likely is not going to be enough to increase your iron level.  Follow up in 3 months with Dr. Rogelia BogaButcher.

## 2016-06-18 NOTE — Progress Notes (Signed)
   Subjective:    Patient ID: Katie ReiningEthel Lee Barker, female    DOB: 1941-04-06, 75 y.o.   MRN: [id number]  HPI  75 yo F with HTN, GERd, post polio syndrome, overactive bladder, iron def anemia, anxiety, presents for follow up of her anemia  Taking iron pill (not ferrous sulfate, can't tell me which one she is taking but says it's 5 mg tablet and she takes 2 of them at night time). No bleeding. No dizziness, weakness, chest pain, or SOB. Last hgb 8.8, ferritin was 7 last time.   Anxiety - takes diazepam as needed, takes it before going to the doctor. Last refilled on 4/03 for #10 tabs.   Asking for a rolling walker with seat.    Review of Systems  Constitutional: Negative for fever and chills.  HENT: Negative for congestion and sore throat.   Respiratory: Negative for chest tightness and shortness of breath.   Cardiovascular: Negative for chest pain, palpitations and leg swelling.  Neurological: Negative for dizziness and numbness.       Objective:   Physical Exam  Constitutional: She appears well-developed and well-nourished. No distress.  Sitting in power chair.   HENT:  Head: Normocephalic and atraumatic.  Eyes: Conjunctivae are normal.  Cardiovascular: Normal rate and regular rhythm.  Exam reveals no gallop and no friction rub.   No murmur heard. Pulmonary/Chest: No respiratory distress. She has no wheezes.  Abdominal: Soft. Bowel sounds are normal. She exhibits no distension. There is no tenderness.  Skin: She is not diaphoretic.    Filed Vitals:   06/18/16 1039  BP: 122/80  Pulse: 76  Temp: 99 F (37.2 C)       Assessment & Plan:  See problem based a&p.

## 2016-06-19 LAB — CBC
Hematocrit: 34.1 % (ref 34.0–46.6)
Hemoglobin: 10.4 g/dL — ABNORMAL LOW (ref 11.1–15.9)
MCH: 20.4 pg — ABNORMAL LOW (ref 26.6–33.0)
MCHC: 30.5 g/dL — ABNORMAL LOW (ref 31.5–35.7)
MCV: 67 fL — ABNORMAL LOW (ref 79–97)
Platelets: 410 10*3/uL — ABNORMAL HIGH (ref 150–379)
RBC: 5.1 x10E6/uL (ref 3.77–5.28)
RDW: 20.9 % — ABNORMAL HIGH (ref 12.3–15.4)
WBC: 4.6 10*3/uL (ref 3.4–10.8)

## 2016-06-19 NOTE — Progress Notes (Signed)
Internal Medicine Clinic Attending  Case discussed with Dr. Ahmed at the time of the visit.  We reviewed the resident's history and exam and pertinent patient test results.  I agree with the assessment, diagnosis, and plan of care documented in the resident's note. 

## 2016-06-25 DIAGNOSIS — G14 Postpolio syndrome: Secondary | ICD-10-CM | POA: Diagnosis not present

## 2016-06-25 DIAGNOSIS — R609 Edema, unspecified: Secondary | ICD-10-CM | POA: Diagnosis not present

## 2016-06-30 DIAGNOSIS — R32 Unspecified urinary incontinence: Secondary | ICD-10-CM | POA: Diagnosis not present

## 2016-06-30 DIAGNOSIS — N319 Neuromuscular dysfunction of bladder, unspecified: Secondary | ICD-10-CM | POA: Diagnosis not present

## 2016-07-07 ENCOUNTER — Telehealth: Payer: Self-pay | Admitting: Internal Medicine

## 2016-07-07 NOTE — Telephone Encounter (Signed)
Patient calling about Meds for a yeast infection.  Pt has tried OTC Meds and she is still burning.

## 2016-07-07 NOTE — Telephone Encounter (Signed)
Spoke with patient who says that she has a yeast infection and has tried OTC meds. With no relief. Requesting a RX for Diflucan that has worked for her in the past several years ago. I offered patient an appointment for evaluation and she refused as "I don't do pelvic exams". Please advise.

## 2016-07-08 NOTE — Telephone Encounter (Signed)
Pt daughter calling back requesting the nurse to call her back today regarding yeast infection. Requesting Diflucan med for her mother. Please call back.

## 2016-07-09 ENCOUNTER — Other Ambulatory Visit: Payer: Self-pay | Admitting: Internal Medicine

## 2016-07-09 MED ORDER — FLUCONAZOLE 150 MG PO TABS: 150.0000 mg | ORAL_TABLET | Freq: Once | ORAL | Status: DC

## 2016-07-09 NOTE — Telephone Encounter (Signed)
Spoke with Dr. Rogelia BogaButcher who is going to review patient's chart and make a treatment decision. Spoke with daughter of the patient and informed her that I would get back with her as soon as we had a plan. Again offered to make an appointment, but daughter said "she is not going to let you do a PAP smear." Explained to her that there are other things that could cause these symptoms and if she has tried OTC counter medications with no relief they may want to perform a pelvic exam. Refused appointment at this time.

## 2016-07-09 NOTE — Telephone Encounter (Signed)
What should I tell the patient when I call back?

## 2016-07-09 NOTE — Telephone Encounter (Signed)
What are her sxs? Is she having burning with urination? Or vaginal d/c?   With her Post-polio, it would be difficult for a pap. I will Rx one diflucan. If that does not help, then she would need to be seen.

## 2016-07-09 NOTE — Telephone Encounter (Signed)
Paged Dr. Rogelia BogaButcher

## 2016-07-09 NOTE — Telephone Encounter (Signed)
Please call pt daughter back  

## 2016-07-10 ENCOUNTER — Encounter: Payer: Self-pay | Admitting: *Deleted

## 2016-07-10 NOTE — Telephone Encounter (Signed)
yes

## 2016-07-10 NOTE — Telephone Encounter (Signed)
Mailed to patient

## 2016-07-10 NOTE — Telephone Encounter (Signed)
Patient picked up RX yesterday and knows to call if the one dose is not effective. Patient requesting a copy of her "iron and cholesterol labs" to be mailed to her. Am I allowed to print and mail or do you need to do this? Thanks!

## 2016-07-10 NOTE — Telephone Encounter (Signed)
Her symptoms that she reported to me was vaginal burning and itching. No complaints of discharge.

## 2016-07-25 DIAGNOSIS — G14 Postpolio syndrome: Secondary | ICD-10-CM | POA: Diagnosis not present

## 2016-07-25 DIAGNOSIS — R609 Edema, unspecified: Secondary | ICD-10-CM | POA: Diagnosis not present

## 2016-07-31 DIAGNOSIS — R32 Unspecified urinary incontinence: Secondary | ICD-10-CM | POA: Diagnosis not present

## 2016-07-31 DIAGNOSIS — N319 Neuromuscular dysfunction of bladder, unspecified: Secondary | ICD-10-CM | POA: Diagnosis not present

## 2016-08-06 DIAGNOSIS — R32 Unspecified urinary incontinence: Secondary | ICD-10-CM | POA: Diagnosis not present

## 2016-08-06 DIAGNOSIS — N319 Neuromuscular dysfunction of bladder, unspecified: Secondary | ICD-10-CM | POA: Diagnosis not present

## 2016-08-25 DIAGNOSIS — G14 Postpolio syndrome: Secondary | ICD-10-CM | POA: Diagnosis not present

## 2016-08-25 DIAGNOSIS — R609 Edema, unspecified: Secondary | ICD-10-CM | POA: Diagnosis not present

## 2016-08-29 DIAGNOSIS — N319 Neuromuscular dysfunction of bladder, unspecified: Secondary | ICD-10-CM | POA: Diagnosis not present

## 2016-08-29 DIAGNOSIS — R32 Unspecified urinary incontinence: Secondary | ICD-10-CM | POA: Diagnosis not present

## 2016-09-25 DIAGNOSIS — R609 Edema, unspecified: Secondary | ICD-10-CM | POA: Diagnosis not present

## 2016-10-06 DIAGNOSIS — N319 Neuromuscular dysfunction of bladder, unspecified: Secondary | ICD-10-CM | POA: Diagnosis not present

## 2016-10-16 ENCOUNTER — Encounter: Payer: Medicare Other | Admitting: Internal Medicine

## 2016-10-20 ENCOUNTER — Other Ambulatory Visit: Payer: Self-pay | Admitting: Internal Medicine

## 2016-10-21 NOTE — Telephone Encounter (Signed)
Refilled with 2 addition refill which should get her to her appointment in December with dr Rogelia BogaButcher

## 2016-10-25 DIAGNOSIS — R609 Edema, unspecified: Secondary | ICD-10-CM | POA: Diagnosis not present

## 2016-11-03 ENCOUNTER — Telehealth: Payer: Self-pay | Admitting: Pharmacist

## 2016-11-03 NOTE — Progress Notes (Signed)
Contacted patient for notification of THN study regarding medication adherence. Patient verbalized understand.

## 2016-11-04 ENCOUNTER — Encounter: Payer: Self-pay | Admitting: Internal Medicine

## 2016-11-05 DIAGNOSIS — N319 Neuromuscular dysfunction of bladder, unspecified: Secondary | ICD-10-CM | POA: Diagnosis not present

## 2016-12-04 ENCOUNTER — Encounter: Payer: Medicare Other | Admitting: Internal Medicine

## 2016-12-07 DIAGNOSIS — N319 Neuromuscular dysfunction of bladder, unspecified: Secondary | ICD-10-CM | POA: Diagnosis not present

## 2017-01-07 DIAGNOSIS — N319 Neuromuscular dysfunction of bladder, unspecified: Secondary | ICD-10-CM | POA: Diagnosis not present

## 2017-01-09 ENCOUNTER — Emergency Department (HOSPITAL_BASED_OUTPATIENT_CLINIC_OR_DEPARTMENT_OTHER)
Admission: EM | Admit: 2017-01-09 | Discharge: 2017-01-09 | Disposition: A | Discharge status: Home / Self Care | Payer: Medicare Other | Attending: Emergency Medicine | Admitting: Emergency Medicine

## 2017-01-09 ENCOUNTER — Encounter (HOSPITAL_BASED_OUTPATIENT_CLINIC_OR_DEPARTMENT_OTHER): Payer: Self-pay | Admitting: *Deleted

## 2017-01-09 ENCOUNTER — Emergency Department (HOSPITAL_BASED_OUTPATIENT_CLINIC_OR_DEPARTMENT_OTHER): Payer: Medicare Other

## 2017-01-09 DIAGNOSIS — R109 Unspecified abdominal pain: Secondary | ICD-10-CM | POA: Diagnosis not present

## 2017-01-09 DIAGNOSIS — Z79899 Other long term (current) drug therapy: Secondary | ICD-10-CM | POA: Diagnosis not present

## 2017-01-09 DIAGNOSIS — R1032 Left lower quadrant pain: Secondary | ICD-10-CM | POA: Diagnosis not present

## 2017-01-09 DIAGNOSIS — I10 Essential (primary) hypertension: Secondary | ICD-10-CM | POA: Insufficient documentation

## 2017-01-09 DIAGNOSIS — R1084 Generalized abdominal pain: Secondary | ICD-10-CM | POA: Insufficient documentation

## 2017-01-09 DIAGNOSIS — F17200 Nicotine dependence, unspecified, uncomplicated: Secondary | ICD-10-CM | POA: Insufficient documentation

## 2017-01-09 LAB — COMPREHENSIVE METABOLIC PANEL
ALBUMIN: 4.7 g/dL (ref 3.5–5.0)
ALT: 17 U/L (ref 14–54)
ANION GAP: 12 (ref 5–15)
AST: 22 U/L (ref 15–41)
Alkaline Phosphatase: 63 U/L (ref 38–126)
BUN: 12 mg/dL (ref 6–20)
CHLORIDE: 101 mmol/L (ref 101–111)
CO2: 24 mmol/L (ref 22–32)
Calcium: 10.1 mg/dL (ref 8.9–10.3)
Creatinine, Ser: 0.69 mg/dL (ref 0.44–1.00)
GFR calc Af Amer: >60 mL/min (ref >60)
GFR calc non Af Amer: >60 mL/min (ref >60)
GLUCOSE: 105 mg/dL — AB (ref 65–99)
POTASSIUM: 3.3 mmol/L — AB (ref 3.5–5.1)
Sodium: 137 mmol/L (ref 135–145)
Total Bilirubin: 0.4 mg/dL (ref 0.3–1.2)
Total Protein: 8.5 g/dL — ABNORMAL HIGH (ref 6.5–8.1)

## 2017-01-09 LAB — CBC
HEMATOCRIT: 39.5 % (ref 36.0–46.0)
HEMOGLOBIN: 12.6 g/dL (ref 12.0–15.0)
MCH: 23.6 pg — AB (ref 26.0–34.0)
MCHC: 31.9 g/dL (ref 30.0–36.0)
MCV: 74 fL — AB (ref 78.0–100.0)
Platelets: 415 10*3/uL — ABNORMAL HIGH (ref 150–400)
RBC: 5.34 MIL/uL — ABNORMAL HIGH (ref 3.87–5.11)
RDW: 14.7 % (ref 11.5–15.5)
WBC: 5.4 10*3/uL (ref 4.0–10.5)

## 2017-01-09 LAB — URINALYSIS, ROUTINE W REFLEX MICROSCOPIC
Bilirubin Urine: NEGATIVE
Glucose, UA: NEGATIVE mg/dL
Hgb urine dipstick: NEGATIVE
KETONES UR: 15 mg/dL — AB
LEUKOCYTES UA: NEGATIVE
NITRITE: NEGATIVE
PH: 7 (ref 5.0–8.0)
PROTEIN: NEGATIVE mg/dL
Specific Gravity, Urine: 1.007 (ref 1.005–1.030)

## 2017-01-09 LAB — LIPASE, BLOOD: LIPASE: 21 U/L (ref 11–51)

## 2017-01-09 MED ORDER — ONDANSETRON HCL 4 MG PO TABS: 4.0000 mg | ORAL_TABLET | Freq: Four times a day (QID) | ORAL | 0 refills | Status: DC

## 2017-01-09 MED ORDER — POLYETHYLENE GLYCOL 3350 17 G PO PACK: 17.0000 g | PACK | Freq: Every day | ORAL | 0 refills | Status: DC

## 2017-01-09 MED ORDER — IOPAMIDOL (ISOVUE-300) INJECTION 61%
100.0000 mL | Freq: Once | INTRAVENOUS | Status: AC | PRN
  Administered 2017-01-09: 100 mL via INTRAVENOUS

## 2017-01-09 NOTE — ED Triage Notes (Signed)
Abdominal pain x 2 months. States she saw her MD 3 days ago and called in her regular rx.

## 2017-01-09 NOTE — ED Notes (Signed)
In to see Pt. After pt. Hit call button.  Pt. Reports she needs to go to restroom to self cath.  Pt. Ambulates with walker to restroom.  Pt. Alert and oriented.  No distress noted in pt.  Pt. Ask that the door stay open due to she is anxious with door shut and causes her to feel very stressed.

## 2017-01-09 NOTE — ED Provider Notes (Signed)
Emergency Department Provider Note   I have reviewed the triage vital signs and the nursing notes.   HISTORY  Chief Complaint Abdominal Pain   HPI Katie Barker is a 76 y.o. female with PMH of GERD, HTN, HLD presents to the emergency room in for evaluation of left flank pain for the past 3 months. Patient states that she has pain daily that is intermittent. No exacerbating or alleviating factors. She denies any history of kidney stone. No hematuria. Denies dysuria. No fevers or chills. Patient denies chest pain or difficulty breathing. She's been taking Tylenol and Motrin at home with no relief.   Past Medical History:  Diagnosis Date  . Anemia, iron deficiency 09/07/2012   Labs from April 2013 include: HgB 9.7 MCV 64 RDW 17 (nl) Ferritin 7 Iron 14 % sat 14 TIBC 454 (high) Stool cards May 2013 negative x 3   . Anxiety 09/07/2012   Minimal benzo use. About 2 per month. Not on contract.   Marland Kitchen. GERD (gastroesophageal reflux disease) 09/07/2012   Controlled on PPI PRN.   . HTN (hypertension) 09/07/2012   On triple drug (single pill) therapy. ARB, HCTZ, and amlodipine. BP well controlled at home. Elevated in MD's office.   . Hyperlipemia 09/07/2012   Her 10 yr CV risk is <5%. LDL goal is < 160. Has been on a statin since about 2003.    . Osteopenia 03/17/2014   Bone density 01/2014 : dual femur -2.1, R forearm -0.7  . Overactive bladder 09/07/2012   Sees urology yearly. Sxs well controlled on Toviaz.   . 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 Upper airway cough syndrome 10/23/2015  . Rash and nonspecific skin eruption 08/07/2015  . Osteopenia 03/17/2014  . GERD (gastroesophageal reflux disease) 09/07/2012  . Anxiety 09/07/2012  . HTN (hypertension) 09/07/2012  . Hyperlipemia 09/07/2012  . Overactive bladder 09/07/2012  . 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 Anemia, iron deficiency 09/07/2012  . Health care maintenance 09/07/2012  .  Constipation 09/07/2012    Past Surgical History:  Procedure Laterality Date  . CARPAL TUNNEL RELEASE     On the L wrist during her late 40's  . TOTAL ABDOMINAL HYSTERECTOMY W/ BILATERAL SALPINGOOPHORECTOMY  03/09/2000   Cervix was removed. Reason for TAH BSO was fibroids    Current Outpatient Rx  . Order #: [id number] Class: Normal  . Order #: [id number] Class: Historical Med  . Order #: [id number] Class: Print  . Order #: [id number] Class: Historical Med  . Order #: [id number] Class: Normal  . Order #: [id number] Class: Normal  . Order #: [id number] Class: Normal  . Order #: [id number] Class: Print  . Order #: [id number] Class: Normal  . Order #: [id number] Class: Historical Med  . Order #: [id number] Class: Normal  . Order #: [id number] Class: Print  . Order #: [id number] Class: Print  . Order #: [id number] Class: Historical Med    Allergies Penicillins; Fluticasone; Omeprazole; Ranitidine; Lipitor [atorvastatin]; and Pravastatin  Family History  Problem Relation Age of Onset  . Asthma Daughter   . Allergic rhinitis Daughter   . Angioedema Neg Hx   . Eczema Neg Hx   . Immunodeficiency Neg Hx   . Urticaria Neg Hx     Social History Social History  Substance Use Topics  . Smoking status: Former Smoker    Types: Cigarettes    Quit date: 03/08/1975  . Smokeless tobacco: Current User  . Alcohol use 0.0 oz/week    Review of Systems  10-point ROS otherwise negative.  ____________________________________________   PHYSICAL EXAM:  VITAL SIGNS: ED Triage Vitals  Enc Vitals Group     BP 01/09/17 1218 134/98     Pulse Rate 01/09/17 1218 94     Resp 01/09/17 1218 20     Temp 01/09/17 1218 98.1 F (36.7 C)     Temp Source 01/09/17 1218 Oral     SpO2 01/09/17 1218 100 %     Weight 01/09/17 1216 145 lb (65.8 kg)     Height 01/09/17 1216 5\' 2"  (1.575 m)     Pain Score 01/09/17 1216 10   Constitutional: Alert and oriented. Well appearing and in no acute distress. Eyes: Conjunctivae are  normal. Head: Atraumatic. Nose: No congestion/rhinnorhea. Mouth/Throat: Mucous membranes are moist.  Neck: No stridor.   Cardiovascular: Normal rate, regular rhythm. Good peripheral circulation. Grossly normal heart sounds.   Respiratory: Normal respiratory effort.  No retractions. Lungs CTAB. Gastrointestinal: Soft and nontender. No distention. No acute rectal prolapse noted. No perirectal abscess or tenderness.  Genitourinary: Normal external genitalia.  Musculoskeletal: No lower extremity tenderness nor edema. No gross deformities of extremities. Neurologic:  Normal speech and language. No gross focal neurologic deficits are appreciated.  Skin:  Skin is warm, dry and intact. No rash noted.  ____________________________________________   LABS (all labs ordered are listed, but only abnormal results are displayed)  Labs Reviewed  URINALYSIS, ROUTINE W REFLEX MICROSCOPIC - Abnormal; Notable for the following:       Result Value   Ketones, ur 15 (*)    All other components within normal limits  COMPREHENSIVE METABOLIC PANEL - Abnormal; Notable for the following:    Potassium 3.3 (*)    Glucose, Bld 105 (*)    Total Protein 8.5 (*)    All other components within normal limits  CBC - Abnormal; Notable for the following:    RBC 5.34 (*)    MCV 74.0 (*)    MCH 23.6 (*)    Platelets 415 (*)    All other components within normal limits  LIPASE, BLOOD   ____________________________________________  RADIOLOGY  Ct Abdomen Pelvis W Contrast  Result Date: 01/09/2017 CLINICAL DATA:  Left-sided flank pain for 2 months. History gastroesophageal reflux disease. Hysterectomy and bilateral oophorectomy. EXAM: CT ABDOMEN AND PELVIS WITH CONTRAST TECHNIQUE: Multidetector CT imaging of the abdomen and pelvis was performed using the standard protocol following bolus administration of intravenous contrast. CONTRAST:  ISOVUE-300 IOPAMIDOL (ISOVUE-300) INJECTION 61% COMPARISON:  None. FINDINGS:  Lower chest: Right basilar scarring. Normal heart size without pericardial or pleural effusion. Hepatobiliary: Normal gallbladder, without biliary ductal dilatation. Normal liver. Pancreas: Normal, without mass or ductal dilatation. Spleen: Normal in size, without focal abnormality. Adrenals/Urinary Tract: Mild left adrenal nodularity. Normal right adrenal gland. An upper pole left renal lesion measures slightly greater than fluid density, including on image 22/series 2. On the order of 11 mm. This is similar in size and T2 hyperintense on the MRI of 01/25/2009, favoring a benign minimally complex cyst. Normal right kidney, without hydronephrosis or hydroureter. pelvic floor laxity with a cystocele Stomach/Bowel: Normal stomach, without wall thickening. Rectal prolapse. Normal small bowel. Vascular/Lymphatic: Aortic and branch vessel atherosclerosis. No abdominopelvic adenopathy. Reproductive: Hysterectomy.  No adnexal mass. Other: No significant free fluid.  Marked pelvic floor laxity. Musculoskeletal: Lumbosacral spondylosis. Convex right lumbar spine curvature. IMPRESSION: 1.  No acute process or explanation for left-sided pain. 2. 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 Electronically Signed  By: Jeronimo Greaves M.D.   On: 01/09/2017 19:14    ____________________________________________   PROCEDURES  Procedure(s) performed:   Procedures  None ____________________________________________   INITIAL IMPRESSION / ASSESSMENT AND PLAN / ED COURSE  Pertinent labs & imaging results that were available during my care of the patient were reviewed by me and considered in my medical decision making (see chart for details).  Patient resents to the emergency department for intermittent left flank pain after 3 months of symptoms. No history of kidney stone. The patient does have some mild left-sided abdominal tenderness on my exam. Plan for CT scan addition to labs which are pending.     Differential diagnosis includes but is not exclusive to ectopic pregnancy, ovarian cyst, ovarian torsion, acute appendicitis, urinary tract infection, endometriosis, bowel obstruction, hernia, colitis, renal colic, gastroenteritis, volvulus etc.  CT with no acute intrabdominal process. No acute rectal prolapse noted and patient reports knowing about this problem for "years." Plan for laxative to have daily BM and follow with PCP to consider GI referral.   At this time, I do not feel there is any life-threatening condition present. I have reviewed and discussed all results (EKG, imaging, lab, urine as appropriate), exam findings with patient. I have reviewed nursing notes and appropriate previous records.  I feel the patient is safe to be discharged home without further emergent workup. Discussed usual and customary return precautions. Patient and family (if present) verbalize understanding and are comfortable with this plan.  Patient will follow-up with their primary care provider. If they do not have a primary care provider, information for follow-up has been provided to them. All questions have been answered.  ____________________________________________  FINAL CLINICAL IMPRESSION(S) / ED DIAGNOSES  Final diagnoses:  Generalized abdominal pain     MEDICATIONS GIVEN DURING THIS VISIT:  Medications  iopamidol (ISOVUE-300) 61 % injection 100 mL (100 mLs Intravenous Contrast Given 01/09/17 1845)     NEW OUTPATIENT MEDICATIONS STARTED DURING THIS VISIT:  Discharge Medication List as of 01/09/2017  7:56 PM    START taking these medications   Details  ondansetron (ZOFRAN) 4 MG tablet Take 1 tablet (4 mg total) by mouth every 6 (six) hours., Starting Fri 01/09/2017, Print    polyethylene glycol (MIRALAX) packet Take 17 g by mouth daily., Starting Fri 01/09/2017, Print        Note:  This document was prepared using Dragon voice recognition software and may include unintentional dictation  errors.  Alona Bene, MD Emergency Medicine   Maia Plan, MD 01/10/17 1037

## 2017-01-09 NOTE — Discharge Instructions (Signed)

## 2017-01-09 NOTE — ED Notes (Signed)
Family at bedside. 

## 2017-01-12 ENCOUNTER — Encounter: Payer: Self-pay | Admitting: Physician Assistant

## 2017-01-21 ENCOUNTER — Encounter: Payer: Self-pay | Admitting: Physician Assistant

## 2017-01-21 ENCOUNTER — Ambulatory Visit (INDEPENDENT_AMBULATORY_CARE_PROVIDER_SITE_OTHER): Payer: Medicare Other | Admitting: Physician Assistant

## 2017-01-21 VITALS — BP 116/78 | HR 96 | Ht 61.81 in | Wt 142.4 lb

## 2017-01-21 DIAGNOSIS — R1032 Left lower quadrant pain: Secondary | ICD-10-CM

## 2017-01-21 DIAGNOSIS — K921 Melena: Secondary | ICD-10-CM | POA: Diagnosis not present

## 2017-01-21 DIAGNOSIS — K623 Rectal prolapse: Secondary | ICD-10-CM

## 2017-01-21 MED ORDER — NA SULFATE-K SULFATE-MG SULF 17.5-3.13-1.6 GM/177ML PO SOLN: 1.0000 | Freq: Once | ORAL | 0 refills | Status: AC

## 2017-01-21 MED ORDER — DICYCLOMINE HCL 10 MG PO CAPS: 10.0000 mg | ORAL_CAPSULE | Freq: Three times a day (TID) | ORAL | 0 refills | Status: DC

## 2017-01-21 NOTE — Patient Instructions (Addendum)
You have been scheduled for a colonoscopy. Please follow written instructions given to you at your visit today.  Please pick up your prep supplies at the pharmacy within the next 1-3 days. [employer] [place].  If you use inhalers (even only as needed), please bring them with you on the day of your procedure. Your physician has requested that you go to www.startemmi.com and enter the access code given to you at your visit today. This web site gives a general overview about your procedure. However, you should still follow specific instructions given to you by our office regarding your preparation for the procedure.  Tale Miralax, 17 grams in 8 oz of water every day. We sent a prescription for Bentyl 10 mg for abd pain, cramping.   We [facility] with [facility].

## 2017-01-21 NOTE — Progress Notes (Signed)
Subjective:    Patient ID: Katie Barker, female    DOB: 07/10/1941, 76 y.o.   MRN: [id number]  HPI Katie Barker is a pleasant 76 year old African-American female, referred today by Dr. Karsten Ro Lowella Curb for evaluation of left lower quadrant and rectal pain. Patient known remotely to Dr. Deatra Ina and had undergone colonoscopy in 2009 pertinent only for internal hemorrhoids. Patient had a recent ER visit on 01/09/2017 with complaints of left flank and left lower quadrant pain. CT of the abdomen and pelvis was done which showed no acute process to account for her symptoms but did show marked pelvic floor laxity with evidence of cystocele and rectal prolapse. Patient has history of hypertension, GERD, post polio syndrome, neurogenic bladder which requires self-catheterization, and prior history of iron deficiency anemia.   She says she's been having pain in her left lower abdomen over the past several months which is increased recently seems to be exacerbated by straining for a bowel movement and is usually worse immediately after a bowel movement. She has also been having a protrusion from her rectum frequently with bowel movements, much worse with straining or constipation which is painful. She says she is able to lie on her side and with lubrication can gently push this tissue back into her rectum. This generally is her abdominal discomfort as well. This is happening evening on an almost daily basis. She has seen small amounts of bright red blood. Her appetite is been fine, weight has been stable. She relates that she knows that she has had prolapse of her bladder which is been present over the past few years.  Review of Systems Pertinent positive and negative review of systems were noted in the above HPI section.  All other review of systems was otherwise negative.  Outpatient Encounter Prescriptions as of 01/21/2017  Medication Sig  . albuterol (PROVENTIL HFA;VENTOLIN HFA) 108 (90 BASE) MCG/ACT inhaler  Inhale 2 puffs into the lungs 2 (two) times daily. Take until your next physician's appt  . aspirin 81 MG tablet Take 81 mg by mouth daily.  . diazepam (VALIUM) 2 MG tablet Take 1 tablet (2 mg total) by mouth as needed for anxiety.  . diphenhydrAMINE (SOMINEX) 25 MG tablet Take 25 mg by mouth daily as needed for allergies or sleep. Reported on 06/06/2016  . EPINEPHrine (EPIPEN 2-PAK) 0.3 mg/0.3 mL IJ SOAJ injection Inject 0.3 mLs (0.3 mg total) into the muscle once.  Marland Kitchen esomeprazole (NEXIUM) 40 MG capsule Take 40 mg by mouth daily at 12 noon.  . fluconazole (DIFLUCAN) 150 MG tablet Take 1 tablet (150 mg total) by mouth once.  Marland Kitchen HYDROcodone-homatropine (HYCODAN) 5-1.5 MG/5ML syrup Take 5 mLs by mouth every 6 (six) hours as needed for cough.  . hydrocortisone cream 1 % Apply 1 application topically 2 (two) times daily.  Marland Kitchen ibuprofen (ADVIL,MOTRIN) 200 MG tablet Take 200 mg by mouth every 6 (six) hours as needed for fever or moderate pain.   . Olmesartan-Amlodipine-HCTZ 20-5-12.5 MG TABS TAKE ONE TABLET BY MOUTH ONCE DAILY  . ondansetron (ZOFRAN) 4 MG tablet Take 1 tablet (4 mg total) by mouth every 6 (six) hours.  . polyethylene glycol (MIRALAX) packet Take 17 g by mouth daily.  . vitamin E 100 UNIT capsule Take 400 Units by mouth daily. OTC med  . dicyclomine (BENTYL) 10 MG capsule Take 1 capsule (10 mg total) by mouth 4 (four) times daily -  before meals and at bedtime.  . Na Sulfate-K Sulfate-Mg Sulf 17.5-3.13-1.6 GM/180ML SOLN  Take 1 kit by mouth once.  . [DISCONTINUED] esomeprazole (NEXIUM) 40 MG capsule Take 1 capsule (40 mg total) by mouth 2 (two) times daily before a meal. (Patient taking differently: Take 40 mg by mouth daily as needed (heartburn). )   No facility-administered encounter medications on file as of 01/21/2017.    Allergies  Allergen Reactions  . Penicillins Other (See Comments)    Per pt took once and had syncope, itch, and was hospitalized.  Has patient had a PCN reaction  causing immediate rash, facial/tongue/throat swelling, SOB or lightheadedness with hypotension: yes Has patient had a PCN reaction causing severe rash involving mucus membranes or skin necrosis: unknown Has patient had a PCN reaction that required hospitalization yes Has patient had a PCN reaction occurring within the last 10 years: no If all of the above answers are "NO", then may proceed with Cephalospor  . Fluticasone Other (See Comments)    Burned nose and eyes.  . Omeprazole Nausea Only    "makes me sick to my stomach"/Itching  . Ranitidine Nausea Only    "make me sick on my stomach"/Itching  . Lipitor [Atorvastatin] Other (See Comments)    HA  . Pravastatin Itching   Patient Active Problem List   Diagnosis Date Noted  . Upper airway cough syndrome 10/23/2015  . Rash and nonspecific skin eruption 08/07/2015  . Osteopenia 03/17/2014  . GERD (gastroesophageal reflux disease) 09/07/2012  . Anxiety 09/07/2012  . HTN (hypertension) 09/07/2012  . Hyperlipemia 09/07/2012  . Overactive bladder 09/07/2012  . 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 Anemia, iron deficiency 09/07/2012  . Health care maintenance 09/07/2012  . Constipation 09/07/2012   Social History   Social History  . Marital status: Divorced    Spouse name: N/A  . Number of children: N/A  . Years of education: N/A   Occupational History  . Not on file.   Social History Main Topics  . Smoking status: Former Smoker    Types: Cigarettes    Quit date: 03/08/1975  . Smokeless tobacco: Never Used  . Alcohol use 0.0 oz/week     Comment: socially  . Drug use: No  . Sexual activity: Not on file   Other Topics Concern  . Not on file   Social History Narrative   Lives alone in an apartment. Independent in ADL's. Drives. Has motorized WC for long distances. Otherwise, uses cane. On disability for post-polio syndrome. Has mail delivered to door. Has permanent handicapped plate.     [name] family history  includes Allergic rhinitis in her daughter; Asthma in her daughter.      Objective:    Vitals:   01/21/17 1358  BP: 116/78  Pulse: 96    Physical Exam  well-developed older African-American female in no acute distress, she comes in in a wheelchair but is able to ambulate with a cane and was able to get on and off the exam table without difficulty without assistance. Blood pressure 116/78 pulse 96, weight 142 BMI of 26. HEENT; nontraumatic normocephalic EOMI PERRLA sclera anicteric, Cardiovascular; regular rate and rhythm with S1-S2 no murmur or gallop, Pulmonary ;clear bilaterally, Abdomen; soft, bowel sounds are present there is no palpable mass or hepatosplenomegaly is some minimal tenderness in the left lower quadrant no guarding or rebound, Rectal ;exam external exam only she has bulging of the perianal area, there is no visible rectal mucosa at present. Extremities; bilateral lower extremity muscular atrophy secondary to polio. Neuropsych; mood and affect appropriate  Assessment & Plan:   #79 76 year old African-American female with post polio syndrome, and neurogenic bladder who presents with several month history of intermittent left lower quadrant pain which seems to be associated with rectal prolapse. She describes very frequent episodes of rectal prolapse, which at this point she is able to self reduce. Her left-sided abdominal pain subsides once the prolapse is reduced.  #2 chronic cystocele #3 GERD #4 hypertension  Plan; patient will need colonoscopy to rule out any occult lesion or other etiologies for her left lower quadrant pain but suspect this is related to rectal prolapse. Colonoscopy will be scheduled with Dr. Havery Moros. Procedure discussed in detail with the patient including risks benefits and she is agreeable to proceed  MiraLAX 17 g in 8 ounces of water on a daily basis to prevent constipation/straining Add trial of Bentyl 10 mg by mouth twice a day as she is  asking for something to help with the left lower quadrant discomfort. We'll schedule for surgical consultation with Dr. Velna Ochs surgery for correction of rectal prolapse. Surgery appointment will be scheduled several weeks out to allow colonoscopy to be completed in the interim.   Amy Genia Harold PA-C 01/21/2017   Cc: Bartholomew Crews, MD

## 2017-01-22 ENCOUNTER — Other Ambulatory Visit: Payer: Self-pay | Admitting: *Deleted

## 2017-01-22 ENCOUNTER — Encounter: Payer: Medicare Other | Admitting: Internal Medicine

## 2017-01-22 ENCOUNTER — Telehealth: Payer: Self-pay | Admitting: Physician Assistant

## 2017-01-22 MED ORDER — DICYCLOMINE HCL 10 MG PO CAPS: ORAL_CAPSULE | ORAL | 2 refills | Status: DC

## 2017-01-22 NOTE — Progress Notes (Signed)
Agree with assessment and plan 

## 2017-01-22 NOTE — Telephone Encounter (Signed)
Advised the patient I called [facility] [place] for the Bentyl 10 mg.

## 2017-01-26 ENCOUNTER — Other Ambulatory Visit: Payer: Self-pay | Admitting: Internal Medicine

## 2017-01-26 DIAGNOSIS — F419 Anxiety disorder, unspecified: Secondary | ICD-10-CM

## 2017-01-27 ENCOUNTER — Telehealth: Payer: Self-pay | Admitting: *Deleted

## 2017-01-27 NOTE — Telephone Encounter (Signed)
[facility] They notified the patient with her appointment on 02-10-2017 . She is to arrive at 10:30  AM for an 11:20 am.

## 2017-01-27 NOTE — Telephone Encounter (Signed)
rx phoned into pharmacy (left on recorder).Kingsley SpittleGoldston, Darlene Cassady1/30/201812:41 PM

## 2017-01-28 ENCOUNTER — Telehealth: Payer: Self-pay | Admitting: Gastroenterology

## 2017-01-28 NOTE — Telephone Encounter (Signed)
Spoke with pt and told her to push her fluids the rest of today and drink prep as instructed.  Understanding voiced

## 2017-01-29 ENCOUNTER — Encounter: Payer: Self-pay | Admitting: Gastroenterology

## 2017-01-29 ENCOUNTER — Ambulatory Visit (AMBULATORY_SURGERY_CENTER): Payer: Medicare Other | Admitting: Gastroenterology

## 2017-01-29 VITALS — BP 113/71 | HR 74 | Temp 97.7°F | Resp 11 | Ht 61.0 in | Wt 142.0 lb

## 2017-01-29 DIAGNOSIS — R1032 Left lower quadrant pain: Secondary | ICD-10-CM | POA: Diagnosis not present

## 2017-01-29 DIAGNOSIS — K623 Rectal prolapse: Secondary | ICD-10-CM | POA: Diagnosis not present

## 2017-01-29 MED ORDER — SODIUM CHLORIDE 0.9 % IV SOLN: 500.0000 mL | INTRAVENOUS | Status: DC

## 2017-01-29 NOTE — Progress Notes (Signed)
Report to PACU, RN, vss, BBS= Clear.  

## 2017-01-29 NOTE — Op Note (Signed)
[facility] Patient Name: Katie Barker Procedure Date: 01/29/2017 1:19 PM MRN: [id number] Endoscopist: Viviann Spare P. Armbruster MD, MD Age: 76 Referring MD:  Date of Birth: 01/26/1941 Gender: Female Account #: [id number] Procedure:                Colonoscopy Indications:              Abdominal pain in the left lower quadrant, history                            of rectal prolapse Medicines:                Monitored Anesthesia Care Procedure:                Pre-Anesthesia Assessment:                           - Prior to the procedure, a History and Physical                            was performed, and patient medications and                            allergies were reviewed. The patient's tolerance of                            previous anesthesia was also reviewed. The risks                            and benefits of the procedure and the sedation                            options and risks were discussed with the patient.                            All questions were answered, and informed consent                            was obtained. Prior Anticoagulants: The patient has                            taken aspirin, last dose was 1 day prior to                            procedure. ASA Grade Assessment: III - A patient                            with severe systemic disease. After reviewing the                            risks and benefits, the patient was deemed in                            satisfactory condition to undergo the procedure.  After obtaining informed consent, the colonoscope                            was passed under direct vision. Throughout the                            procedure, the patient's blood pressure, pulse, and                            oxygen saturations were monitored continuously. The                            Model PCF-H190L [id number]) scope was introduced                            through the anus and advanced  to the the cecum,                            identified by appendiceal orifice and ileocecal                            valve. The colonoscopy was performed without                            difficulty. The patient tolerated the procedure                            well. The quality of the bowel preparation was                            fair. The ileocecal valve, appendiceal orifice, and                            rectum were photographed. Scope In: 1:23:40 PM Scope Out: 1:39:27 PM Scope Withdrawal Time: 0 hours 9 minutes 27 seconds  Total Procedure Duration: 0 hours 15 minutes 47 seconds  Findings:                 The perianal exam findings include low tone.                           A large amount of semi-liquid stool was found in                            the entire colon, making visualization difficult.                            The bowel prep was not adequate for screening                            purposes. The distal transverse and splenic flexure                            / descending colon had significant stool burden  which was not able to be cleared, polyps in this                            area may not have been appreciated.                           Internal hemorrhoids and inflamed tissue proximal                            to the dentate were found during retroflexion,                            consistent with changes from prolapse. The                            hemorrhoids were large.                           The exam was otherwise without abnormality. Complications:            No immediate complications. Estimated blood loss:                            Minimal. Estimated Blood Loss:     Estimated blood loss was minimal. Impression:               - Preparation of the colon was fair.                           - Low tone found on perianal exam.                           - Stool in the entire examined colon, some areas                             not able to be cleared as above.                           - Internal hemorrhoids.                           - The examination was otherwise normal.                           No etiology for pain noted on this exam. Recommendation:           - Patient has a contact number available for                            emergencies. The signs and symptoms of potential                            delayed complications were discussed with the                            patient. Return to normal activities  tomorrow.                            Written discharge instructions were provided to the                            patient.                           - Resume previous diet.                           - Continue present medications.                           - Daily fiber supplement                           - Await surgical evaluation for prolapse - consider                            surgery versus pelvic floor PT                           - Repeat colonoscopy in the next year or so for                            screening purposes given prep noted on this exam Steven P. Armbruster MD, MD 01/29/2017 1:47:20 PM This report has been signed electronically.

## 2017-01-29 NOTE — Patient Instructions (Addendum)
YOU HAD AN ENDOSCOPIC PROCEDURE TODAY AT THE [facility]:   Refer to the procedure report that was given to you for any specific questions about what was found during the examination.  If the procedure report does not answer your questions, please call your gastroenterologist to clarify.  If you requested that your care partner not be given the details of your procedure findings, then the procedure report has been included in a sealed envelope for you to review at your convenience later.  YOU SHOULD EXPECT: Some feelings of bloating in the abdomen. Passage of more gas than usual.  Walking can help get rid of the air that was put into your GI tract during the procedure and reduce the bloating. If you had a lower endoscopy (such as a colonoscopy or flexible sigmoidoscopy) you may notice spotting of blood in your stool or on the toilet paper. If you underwent a bowel prep for your procedure, you may not have a normal bowel movement for a few days.  Please Note:  You might notice some irritation and congestion in your nose or some drainage.  This is from the oxygen used during your procedure.  There is no need for concern and it should clear up in a day or so.  SYMPTOMS TO REPORT IMMEDIATELY:   Following lower endoscopy (colonoscopy or flexible sigmoidoscopy):  Excessive amounts of blood in the stool  Significant tenderness or worsening of abdominal pains  Swelling of the abdomen that is new, acute  Fever of 100F or higher    For urgent or emergent issues, a gastroenterologist can be reached at any hour by calling [phone].   DIET:  We do recommend a small meal at first, but then you may proceed to your regular diet.  Drink plenty of fluids but you should avoid alcoholic beverages for 24 hours.  ACTIVITY:  You should plan to take it easy for the rest of today and you should NOT DRIVE or use heavy machinery until tomorrow (because of the sedation medicines used during the test).     FOLLOW UP: Our staff will call the number listed on your records the next business day following your procedure to check on you and address any questions or concerns that you may have regarding the information given to you following your procedure. If we do not reach you, we will leave a message.  However, if you are feeling well and you are not experiencing any problems, there is no need to return our call.  We will assume that you have returned to your regular daily activities without incident.  If any biopsies were taken you will be contacted by phone or by letter within the next 1-3 weeks.  Please call us at [phone] if you have not heard about the biopsies in 3 weeks.    SIGNATURES/CONFIDENTIALITY: You and/or your care partner have signed paperwork which will be entered into your electronic medical record.  These signatures attest to the fact that that the information above on your After Visit Summary has been reviewed and is understood.  Full responsibility of the confidentiality of this discharge information lies with you and/or your care-partner    INFORMATION ON HEMORRHOIDS GIVEN TO YOU TODAY  DAILY FIBER SUPPLEMENT   AWAIT SURGICAL EVALUATION FOR PROLAPSE - APPOINTMENT HAS ALREADY BEEN MADE  REPEAT COLONOSCOPY IN APPROXIMATELY 1 YEAR DUE TO PREP NOT BEING GOOD  DAILY FIBER SUPPLEMENT

## 2017-01-30 ENCOUNTER — Telehealth: Payer: Self-pay

## 2017-01-30 NOTE — Telephone Encounter (Signed)
  Follow up Call-  Call back number 01/29/2017  Post procedure Call Back phone  # [phone]  Permission to leave phone message Yes  Some recent data might be hidden     Patient questions:  Do you have a fever, pain , or abdominal swelling? No. Pain Score  0 *  Have you tolerated food without any problems? Yes.    Have you been able to return to your normal activities? Yes.    Do you have any questions about your discharge instructions: Diet   No. Medications  No. Follow up visit  No.  Do you have questions or concerns about your Care? No.  Actions: * If pain score is 4 or above: No action needed, pain <4.

## 2017-02-10 ENCOUNTER — Other Ambulatory Visit: Payer: Self-pay | Admitting: General Surgery

## 2017-02-10 NOTE — H&P (Signed)
History of Present Illness Katie Barker(Katie Rapaport MD; 02/10/2017 11:47 AM) The patient is a 76 year old female who presents with anal pain. 76 year old female who presents to the office for evaluation of anal pain and prolapsing rectal tissue. She states that after bowel movements she notices pain and prolapse. This occurs with almost every bowel movement. She also has some left lower quadrant pain and is currently being worked up for bladder prolapse. She has a neurogenic bladder and self-catheterizes on a daily basis. She underwent a colonoscopy recently. This showed large hemorrhoids.   Past Surgical History Katie Barker(Katie Barker, Barker; 02/10/2017 11:39 AM) Breast Biopsy Left. Foot Surgery Right. Hysterectomy (not due to cancer) - Complete  Diagnostic Studies History Katie Barker(Katie Barker, Barker; 02/10/2017 11:39 AM) Colonoscopy 5-10 years ago Mammogram 1-3 years ago Pap Smear 1-5 years ago  Allergies Katie Barker(Katie Barker, Barker; 02/10/2017 11:40 AM) Fluticasone Propionate (Inhal) *ANTIASTHMATIC AND BRONCHODILATOR AGENTS* Omeprazole *CHEMICALS* Ranitidine *ULCER DRUGS* Lipitor *ANTIHYPERLIPIDEMICS* Pravastatin Sodium *ANTIHYPERLIPIDEMICS*  Medication History Katie Barker(Katie Barker, Barker; 02/10/2017 11:41 AM) DiazePAM (2MG  Tablet, Oral) Active. Dicyclomine HCl (10MG  Capsule, Oral) Active. Esomeprazole Magnesium (40MG  Capsule DR, Oral) Active. Toviaz (8MG  Tablet ER 24HR, Oral) Active. Vitamin C (500MG  Tablet, Oral) Active. Vitamin D (Cholecalciferol) (1000UNIT Capsule, Oral) Active. Aspirin (81MG  Tablet, Oral) Active. Ibuprofen (200MG  Capsule, Oral) Active. Vitamin E (Oral) Specific strength unknown - Active. Medications Reconciled  Social History Katie Barker(Katie Barker, Barker; 02/10/2017 11:39 AM) Alcohol use Moderate alcohol use. No caffeine use No drug use Tobacco use Former smoker.  Family History Katie Barker(Katie Barker, Barker; 02/10/2017 11:39 AM) Breast Cancer Sister.  Pregnancy / Birth History Katie Barker(Katie  Barker, Barker; 02/10/2017 11:39 AM) Katie BannisterGravida 3 Maternal age 22-25 Para 2  Other Problems Katie Barker(Katie Barker, Barker; 02/10/2017 11:39 AM) Arthritis Back Pain Bladder Problems High blood pressure     Review of Systems Katie Barker(Katie Barker; 02/10/2017 11:39 AM) General Present- Appetite Loss and Fatigue. Not Present- Chills, Fever, Night Sweats, Weight Gain and Weight Loss. Skin Present- Dryness. Not Present- Change in Wart/Mole, Hives, Jaundice, New Lesions, Non-Healing Wounds, Rash and Ulcer. HEENT Present- Ringing in the Ears and Wears glasses/contact lenses. Not Present- Earache, Hearing Loss, Hoarseness, Nose Bleed, Oral Ulcers, Seasonal Allergies, Sinus Pain, Sore Throat, Visual Disturbances and Yellow Eyes. Respiratory Not Present- Bloody sputum, Chronic Cough, Difficulty Breathing, Snoring and Wheezing. Cardiovascular Present- Leg Cramps. Not Present- Chest Pain, Difficulty Breathing Lying Down, Palpitations, Rapid Heart Rate, Shortness of Breath and Swelling of Extremities. Gastrointestinal Present- Abdominal Pain, Bloody Stool, Constipation, Hemorrhoids, Indigestion and Rectal Pain. Not Present- Bloating, Change in Bowel Habits, Chronic diarrhea, Difficulty Swallowing, Excessive gas, Gets full quickly at meals, Nausea and Vomiting. Female Genitourinary Present- Nocturia. Not Present- Frequency, Painful Urination, Pelvic Pain and Urgency. Musculoskeletal Present- Back Pain, Joint Pain, Muscle Weakness and Swelling of Extremities. Not Present- Joint Stiffness and Muscle Pain. Neurological Present- Numbness, Trouble walking and Weakness. Not Present- Decreased Memory, Fainting, Headaches, Seizures, Tingling and Tremor. Psychiatric Present- Anxiety and Change in Sleep Pattern. Not Present- Bipolar, Depression, Fearful and Frequent crying. Endocrine Present- Hair Changes. Not Present- Cold Intolerance, Excessive Hunger, Heat Intolerance, Hot flashes and New Diabetes.  Vitals Katie Barker(Katie Barker  Barker; 02/10/2017 11:42 AM) 02/10/2017 11:41 AM Weight: 144.6 lb Height: 62in Height was reported by patient. Body Surface Area: 1.67 m Body Mass Index: 26.45 kg/m  Temp.: 98.36F  Pulse: 82 (Regular)  BP: 120/70 (Sitting, Left Arm, Standard)      Physical Exam Katie Barker(Katie Ontko MD; 02/10/2017 12:41 PM)  General Mental Status-Alert. General Appearance-Not in acute  distress. Build & Nutrition-Well nourished. Posture-Normal posture. Gait-Normal.  Head and Neck Head-normocephalic, atraumatic with no lesions or palpable masses. Trachea-midline.  Chest and Lung Exam Chest and lung exam reveals -on auscultation, normal breath sounds, no adventitious sounds and normal vocal resonance.  Cardiovascular Cardiovascular examination reveals -normal heart sounds, regular rate and rhythm with no murmurs and no digital clubbing, cyanosis, edema, increased warmth or tenderness.  Abdomen Inspection Inspection of the abdomen reveals - No Hernias. Palpation/Percussion Palpation and Percussion of the abdomen reveal - Soft, Non Tender, No Rigidity (guarding), No hepatosplenomegaly and No Palpable abdominal masses.  Rectal Anorectal Exam External - skin tag. Internal - normal sphincter tone.  Neurologic Neurologic evaluation reveals -alert and oriented x 3 with no impairment of recent or remote memory, normal attention span and ability to concentrate, normal sensation and normal coordination.   Results Katie Levee MD; 02/10/2017 12:43 PM) Procedures  Name Value Date Hemorrhoids Procedure Other: Procedure: Anoscopy Surgeon: Maisie Fus After the risks and benefits were explained, verbal consent was obtained for above procedure. A medical assistant chaperone was present thoroughout the entire procedure. Anesthesia: none Diagnosis: anal pain Findings: Lax pelvic floor, good sphincter tone, circumferential skin tags, minor. Patient has a grade 2 right anterior and  left lateral internal hemorrhoid with minimal inflammation. She has a grade 3, large internal hemorrhoid with significant inflammation in the right posterior position.  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 On physical exam she has a grade 3 right posterior internal hemorrhoid. She has no signs of rectal prolapse. We discussed rubber band ligation versus excision. She would like to proceed with surgical excision. We have discussed this in detail. All questions were answered. Risks include bleeding, pain and recurrence.

## 2017-02-26 ENCOUNTER — Encounter: Payer: Medicare Other | Admitting: Internal Medicine

## 2017-03-17 ENCOUNTER — Encounter (HOSPITAL_COMMUNITY): Payer: Self-pay

## 2017-03-17 ENCOUNTER — Emergency Department (HOSPITAL_COMMUNITY)
Admission: EM | Admit: 2017-03-17 | Discharge: 2017-03-17 | Disposition: A | Discharge status: Home / Self Care | Payer: Medicare Other | Attending: Emergency Medicine | Admitting: Emergency Medicine

## 2017-03-17 DIAGNOSIS — IMO0002 Reserved for concepts with insufficient information to code with codable children: Secondary | ICD-10-CM

## 2017-03-17 DIAGNOSIS — M545 Low back pain: Secondary | ICD-10-CM | POA: Diagnosis present

## 2017-03-17 DIAGNOSIS — M5442 Lumbago with sciatica, left side: Secondary | ICD-10-CM | POA: Diagnosis not present

## 2017-03-17 DIAGNOSIS — Z7982 Long term (current) use of aspirin: Secondary | ICD-10-CM | POA: Insufficient documentation

## 2017-03-17 DIAGNOSIS — I10 Essential (primary) hypertension: Secondary | ICD-10-CM | POA: Diagnosis not present

## 2017-03-17 DIAGNOSIS — M5126 Other intervertebral disc displacement, lumbar region: Secondary | ICD-10-CM | POA: Insufficient documentation

## 2017-03-17 DIAGNOSIS — M5432 Sciatica, left side: Secondary | ICD-10-CM

## 2017-03-17 DIAGNOSIS — Z87891 Personal history of nicotine dependence: Secondary | ICD-10-CM | POA: Insufficient documentation

## 2017-03-17 LAB — URINALYSIS, ROUTINE W REFLEX MICROSCOPIC
Bilirubin Urine: NEGATIVE
GLUCOSE, UA: NEGATIVE mg/dL
Hgb urine dipstick: NEGATIVE
KETONES UR: NEGATIVE mg/dL
LEUKOCYTES UA: NEGATIVE
Nitrite: NEGATIVE
PROTEIN: NEGATIVE mg/dL
Specific Gravity, Urine: 1.006 (ref 1.005–1.030)
pH: 7 (ref 5.0–8.0)

## 2017-03-17 MED ORDER — HYDROCODONE-ACETAMINOPHEN 5-325 MG PO TABS
2.0000 | ORAL_TABLET | Freq: Once | ORAL | Status: AC
  Administered 2017-03-17: 2 via ORAL
  Filled 2017-03-17: qty 2

## 2017-03-17 MED ORDER — HYDROCODONE-ACETAMINOPHEN 5-325 MG PO TABS: 1.0000 | ORAL_TABLET | ORAL | 0 refills | Status: DC | PRN

## 2017-03-17 MED ORDER — IBUPROFEN 600 MG PO TABS: 600.0000 mg | ORAL_TABLET | Freq: Three times a day (TID) | ORAL | 0 refills | Status: DC | PRN

## 2017-03-17 MED ORDER — IBUPROFEN 800 MG PO TABS
800.0000 mg | ORAL_TABLET | Freq: Once | ORAL | Status: AC
  Administered 2017-03-17: 800 mg via ORAL
  Filled 2017-03-17: qty 1

## 2017-03-17 NOTE — ED Triage Notes (Signed)
Patient c/o left flank pain x 4 months and worse today. Patient denies any fever. Patient self catheterizes.

## 2017-03-17 NOTE — ED Provider Notes (Signed)
[facility]-EMERGENCY DEPT Provider Note   CSN: [id number] Arrival date & time: 03/17/17  1355     History   Chief Complaint Chief Complaint  Patient presents with  . Flank Pain    HPI Katie Barker is a 76 y.o. female.  HPI Patient poor she's had pain on her left lower and lateral back for 4 months. It is usually present and sometimes she feels some pain wrapping around to her left lower abdomen. She reports that the pain in her back seems worse recently and she feels discomfort is radiating out to the side of her upper leg. 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 She reports however no new or changed weakness or numbness or level of function for her gait. She denies urinary pain burning or urgency but does self cath at baseline. Past Medical History:  Diagnosis Date  . Allergy   . Anemia, iron deficiency 09/07/2012   Labs from April 2013 include: HgB 9.7 MCV 64 RDW 17 (nl) Ferritin 7 Iron 14 % sat 14 TIBC 454 (high) Stool cards May 2013 negative x 3   . Anxiety 09/07/2012   Minimal benzo use. About 2 per month. Not on contract.   Marland Kitchen GERD (gastroesophageal reflux disease) 09/07/2012   Controlled on PPI PRN.   . HTN (hypertension) 09/07/2012   On triple drug (single pill) therapy. ARB, HCTZ, and amlodipine. BP well controlled at home. Elevated in MD's office.   . Hyperlipemia 09/07/2012   Her 10 yr CV risk is <5%. LDL goal is < 160. Has been on a statin since about 2003.    . Osteopenia 03/17/2014   Bone density 01/2014 : dual femur -2.1, R forearm -0.7  . Overactive bladder 09/07/2012   Sees urology yearly. Sxs well controlled on Toviaz.   . 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 Upper airway cough syndrome 10/23/2015  . Rash and nonspecific skin eruption 08/07/2015  . Osteopenia 03/17/2014  . GERD (gastroesophageal reflux disease) 09/07/2012  . Anxiety 09/07/2012  . HTN (hypertension)  09/07/2012  . Hyperlipemia 09/07/2012  . Overactive bladder 09/07/2012  . 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 Anemia, iron deficiency 09/07/2012  . Health care maintenance 09/07/2012  . Constipation 09/07/2012    Past Surgical History:  Procedure Laterality Date  . CARPAL TUNNEL RELEASE     On the L wrist during her late 40's  . TOTAL ABDOMINAL HYSTERECTOMY W/ BILATERAL SALPINGOOPHORECTOMY  03/09/2000   Cervix was removed. Reason for TAH BSO was fibroids    OB History    No data available       Home Medications    Prior to Admission medications   Medication Sig Start Date End Date Taking? Authorizing Provider  Ascorbic Acid (VITAMIN C) 100 MG tablet Take 100 mg by mouth daily.    Historical Provider, MD  aspirin 81 MG tablet Take 81 mg by mouth daily.    Historical Provider, MD  cholecalciferol (VITAMIN D) 1000 units tablet Take 1,000 Units by mouth daily.    Historical Provider, MD  diazepam (VALIUM) 2 MG tablet TAKE ONE TABLET BY MOUTH AS NEEDED FOR ANXIETY 01/26/17   Burns Spain, MD  dicyclomine (BENTYL) 10 MG capsule Take 1 tab twice daily as needed for abdominal pain and cramping. 01/22/17   Amy S Esterwood, PA-C  diphenhydrAMINE (SOMINEX) 25 MG tablet Take 25 mg by mouth  daily as needed for allergies or sleep. Reported on 06/06/2016    Historical Provider, MD  EPINEPHrine (EPIPEN 2-PAK) 0.3 mg/0.3 mL IJ SOAJ injection Inject 0.3 mLs (0.3 mg total) into the muscle once. 06/06/16   Roselyn Kara MeadM Hicks, MD  esomeprazole (NEXIUM) 40 MG capsule Take 40 mg by mouth daily at 12 noon.    Historical Provider, MD  HYDROcodone-acetaminophen (NORCO/VICODIN) 5-325 MG tablet Take 1-2 tablets by mouth every 4 (four) hours as needed for moderate pain or severe pain. 03/17/17   Arby BarretteMarcy Jaelani Posa, MD  ibuprofen (ADVIL,MOTRIN) 200 MG tablet Take 200 mg by mouth every 6 (six) hours as needed for fever or moderate pain.     Historical Provider, MD  ibuprofen (ADVIL,MOTRIN) 600 MG tablet Take 1 tablet  (600 mg total) by mouth every 8 (eight) hours as needed. 03/17/17   Arby BarretteMarcy Isak Sotomayor, MD  Olmesartan-Amlodipine-HCTZ 20-5-12.5 MG TABS Take 1 tablet by mouth daily. 02/24/17   Historical Provider, MD  ondansetron (ZOFRAN) 4 MG tablet Take 1 tablet (4 mg total) by mouth every 6 (six) hours. Patient not taking: Reported on 01/29/2017 01/09/17   Maia PlanJoshua G Long, MD  TOVIAZ 8 MG TB24 tablet Take 8 mg by mouth daily.  11/25/16   Historical Provider, MD  vitamin E 400 UNIT capsule Take 400 Units by mouth daily.    Historical Provider, MD    Family History Family History  Problem Relation Age of Onset  . Asthma Daughter   . Allergic rhinitis Daughter   . Breast cancer Sister   . Angioedema Neg Hx   . Eczema Neg Hx   . Immunodeficiency Neg Hx   . Urticaria Neg Hx     Social History Social History  Substance Use Topics  . Smoking status: Former Smoker    Types: Cigarettes    Quit date: 03/08/1975  . Smokeless tobacco: Never Used  . Alcohol use 0.0 oz/week     Comment: socially     Allergies   Penicillins; Fluticasone; Omeprazole; Ranitidine; Lipitor [atorvastatin]; and Pravastatin   Review of Systems Review of Systems 10 Systems reviewed and are negative for acute change except as noted in the HPI.   Physical Exam Updated Vital Signs BP 128/89 (BP Location: Right Arm)   Pulse 86   Temp 98.9 F (37.2 C) (Oral)   Resp 18   Ht 5\' 1"  (1.549 m)   Wt 144 lb (65.3 kg)   LMP 06/15/1987   SpO2 96%   BMI 27.21 kg/m   Physical Exam  Constitutional: She is oriented to person, place, and time. She appears well-developed and well-nourished. No distress.  HENT:  Head: Normocephalic and atraumatic.  Neck: Neck supple.  Cardiovascular: Normal rate and regular rhythm.   No murmur heard. Pulmonary/Chest: Effort normal and breath sounds normal. No respiratory distress.  Abdominal: Soft. She exhibits no distension. There is no tenderness. There is no guarding.  Musculoskeletal: She exhibits  tenderness. She exhibits no edema.  Left lower back in the paraspinous muscle and buttock patient has tenderness to palpation. Strength is intact bilateral lower extremities.  Neurological: She is alert and oriented to person, place, and time. No cranial nerve deficit. She exhibits normal muscle tone. Coordination normal.  Skin: Skin is warm and dry.  Psychiatric: She has a normal mood and affect.  Nursing note and vitals reviewed.    ED Treatments / Results  Labs (all labs ordered are listed, but only abnormal results are displayed) Labs Reviewed  URINALYSIS, ROUTINE W REFLEX  MICROSCOPIC - Abnormal; Notable for the following:       Result Value   Color, Urine STRAW (*)    All other components within normal limits    EKG  EKG Interpretation None       Radiology No results found.  Procedures Procedures (including critical care time)  Medications Ordered in ED Medications  HYDROcodone-acetaminophen (NORCO/VICODIN) 5-325 MG per tablet 2 tablet (2 tablets Oral Given 03/17/17 2342)  ibuprofen (ADVIL,MOTRIN) tablet 800 mg (800 mg Oral Given 03/17/17 2342)     Initial Impression / Assessment and Plan / ED Course  I have reviewed the triage vital signs and the nursing notes.  Pertinent labs & imaging results that were available during my care of the patient were reviewed by me and considered in my medical decision making (see chart for details).      Final Clinical Impressions(s) / ED Diagnoses   Final diagnoses:  Sciatica of left side  Herniated nucleus pulposus   Patient had CT scan done at the beginning of the year for similar presentation. At this time I do not feel that this is an intra-abdominal process. Urinalysis is negative. Patient does not have new or changed neurologic function. She has had this pain for several months and it now radiates to her buttock and leg very suggestive of sciatica or possible disc herniation. There is however no new motor weakness or  numbness. Patient did have polio in her youth but has good lower extremity function. Patient's counseled on follow-up plan will and given Vicodin and ibuprofen for pain control. New Prescriptions Discharge Medication List as of 03/17/2017 11:34 PM    START taking these medications   Details  HYDROcodone-acetaminophen (NORCO/VICODIN) 5-325 MG tablet Take 1-2 tablets by mouth every 4 (four) hours as needed for moderate pain or severe pain., Starting Tue 03/17/2017, Print    !! ibuprofen (ADVIL,MOTRIN) 600 MG tablet Take 1 tablet (600 mg total) by mouth every 8 (eight) hours as needed., Starting Tue 03/17/2017, Print     !! - Potential duplicate medications found. Please discuss with provider.       Arby Barrette, MD 03/27/17 [phone]

## 2017-03-17 NOTE — Discharge Instructions (Signed)
See your doctor for recheck within the next 3-5 days. Discuss getting scheduled for an MRI for further evaluation of your back pain. Your last MRI in 2010 showed significant disc herniations and degenerative back disease.

## 2017-03-17 NOTE — ED Notes (Signed)
Pt verbalized understanding of discharge instructions.

## 2017-03-23 ENCOUNTER — Other Ambulatory Visit: Payer: Self-pay | Admitting: Internal Medicine

## 2017-03-24 ENCOUNTER — Other Ambulatory Visit: Payer: Self-pay | Admitting: Internal Medicine

## 2017-03-24 DIAGNOSIS — Z8612 Personal history of poliomyelitis: Secondary | ICD-10-CM

## 2017-03-24 DIAGNOSIS — M5136 Other intervertebral disc degeneration, lumbar region: Secondary | ICD-10-CM

## 2017-03-24 DIAGNOSIS — M5116 Intervertebral disc disorders with radiculopathy, lumbar region: Secondary | ICD-10-CM

## 2017-04-04 ENCOUNTER — Other Ambulatory Visit: Payer: Medicare Other

## 2017-04-18 ENCOUNTER — Other Ambulatory Visit: Payer: Medicare Other

## 2017-04-18 ENCOUNTER — Ambulatory Visit
Admission: RE | Admit: 2017-04-18 | Discharge: 2017-04-18 | Disposition: A | Discharge status: Home / Self Care | Payer: Medicare Other | Source: Ambulatory Visit | Attending: Internal Medicine | Admitting: Internal Medicine

## 2017-04-18 DIAGNOSIS — M5116 Intervertebral disc disorders with radiculopathy, lumbar region: Secondary | ICD-10-CM

## 2017-04-18 DIAGNOSIS — Z8612 Personal history of poliomyelitis: Secondary | ICD-10-CM

## 2017-04-18 DIAGNOSIS — M5136 Other intervertebral disc degeneration, lumbar region: Secondary | ICD-10-CM

## 2017-04-27 ENCOUNTER — Other Ambulatory Visit: Payer: Self-pay | Admitting: Internal Medicine

## 2017-04-27 DIAGNOSIS — F419 Anxiety disorder, unspecified: Secondary | ICD-10-CM

## 2017-04-27 NOTE — Telephone Encounter (Signed)
Refill denied-per chart "No longer [facility] patient TRANSFER CARE 02/23/17"

## 2017-05-23 IMAGING — CT CT ABD-PELV W/ CM
2 of 6 series · 16 of 46 positions shown, 18 images · IV contrast (APPLIED)
Comparison: None.

CLINICAL DATA: Left-sided flank pain for 2 months. History
gastroesophageal reflux disease. Hysterectomy and bilateral
oophorectomy.

EXAM:
CT ABDOMEN AND PELVIS WITH CONTRAST
TECHNIQUE: Multidetector CT imaging of the abdomen and pelvis was performed
using the standard protocol following bolus administration of
intravenous contrast.
CONTRAST:  100mL [id number] IOPAMIDOL ([id number]-[id number]) INJECTION 61%

[Series 2: axial st · axial · 0.70mm/px · z∈[+704,+1029]mm · 13 of 75 slices shown, 15 images]
[im 5/75  soft-tissue]
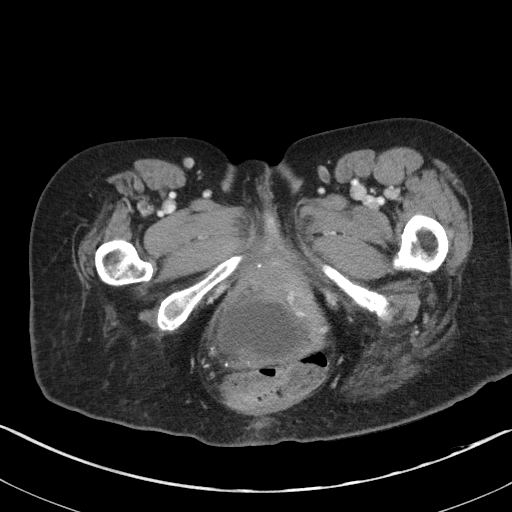
[im 5/75  bone]
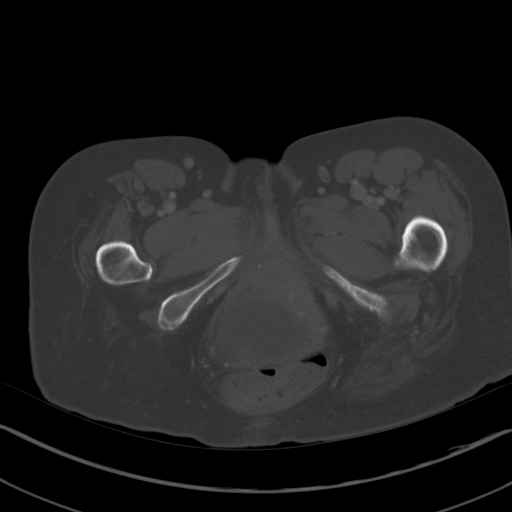
[im 10/75  soft-tissue]
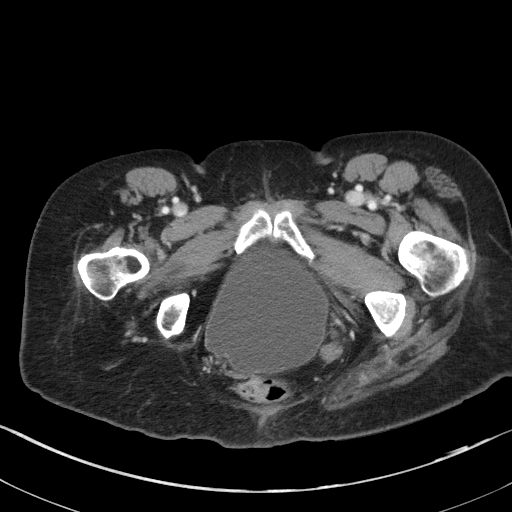
[im 15/75  soft-tissue]
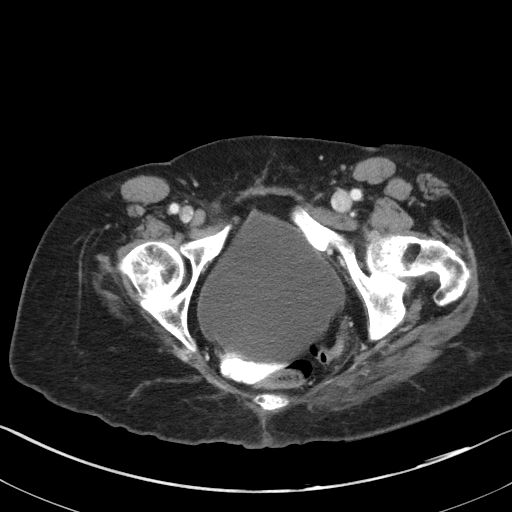
[im 20/75  soft-tissue]
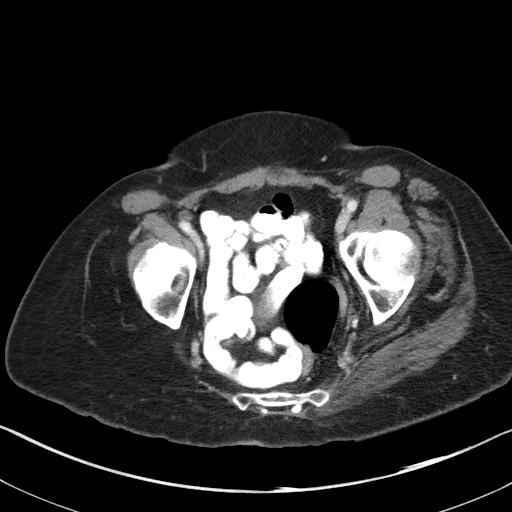
[im 25/75  soft-tissue]
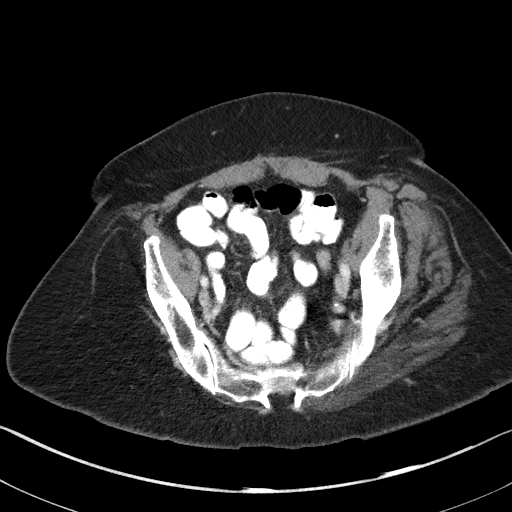
[im 30/75  soft-tissue]
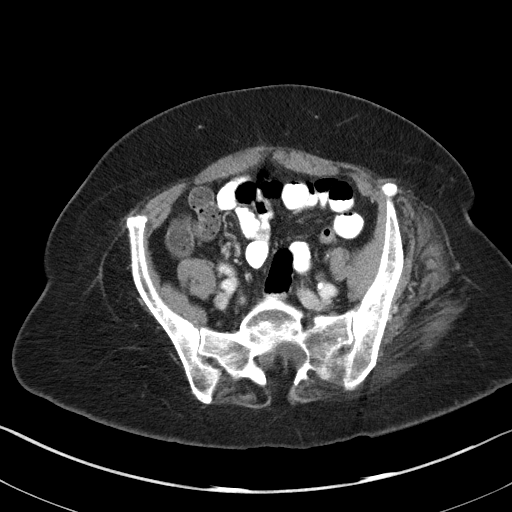
[im 40/75  soft-tissue]
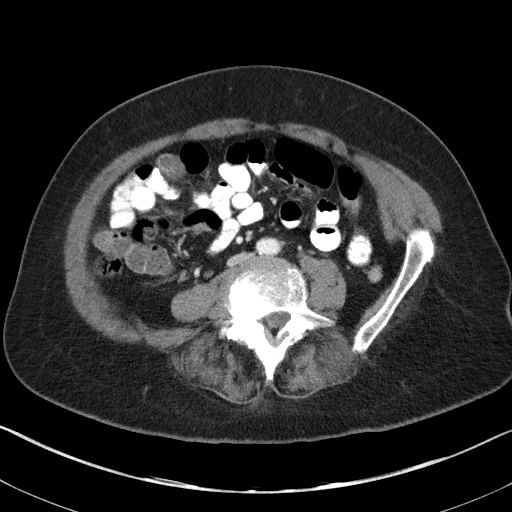
[im 45/75  soft-tissue]
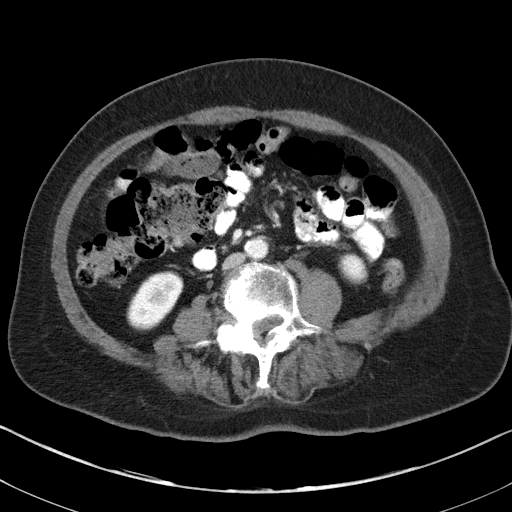
[im 50/75  soft-tissue]
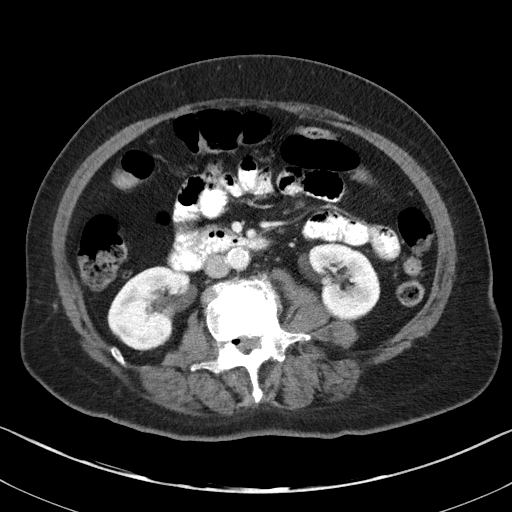
[im 50/75  bone]
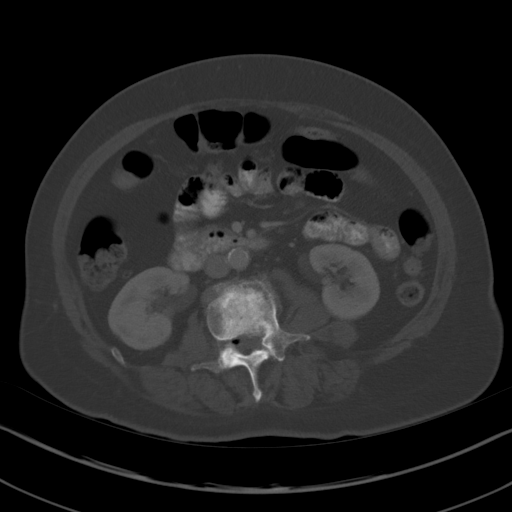
[im 55/75  soft-tissue]
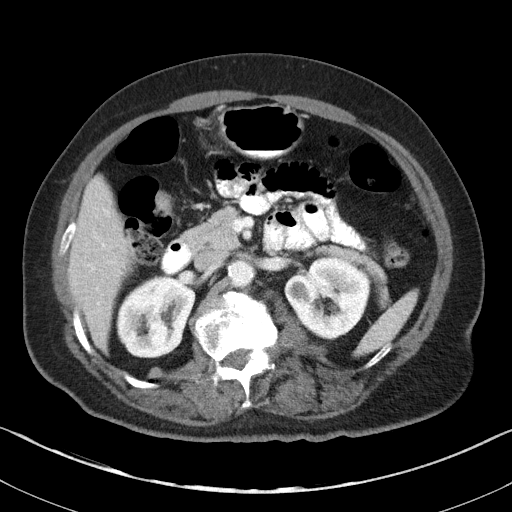
[im 60/75  soft-tissue]
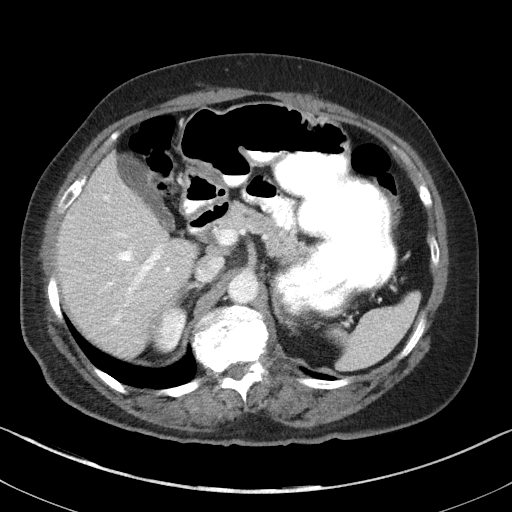
[im 65/75  soft-tissue]
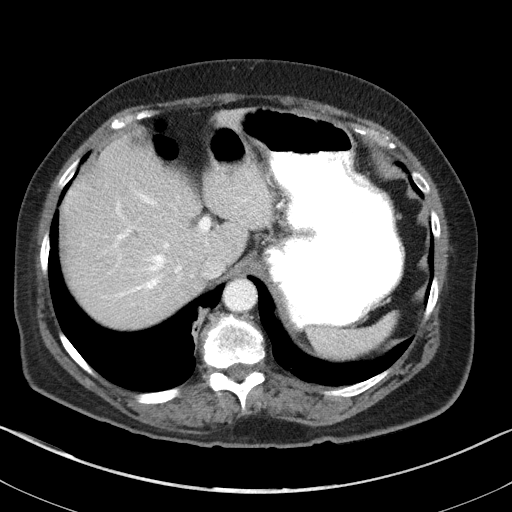
[im 70/75  soft-tissue]
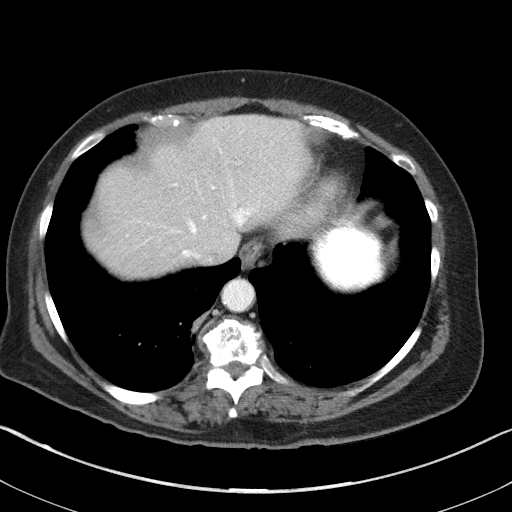

[Series 5: coronal st · coronal · 0.70mm/px · 3 of 85 slices shown]
[im 29/85  soft-tissue]
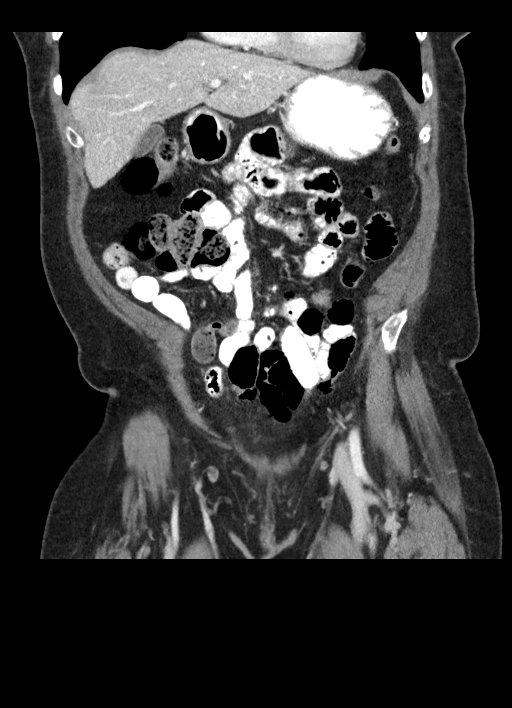
[im 38/85  soft-tissue]
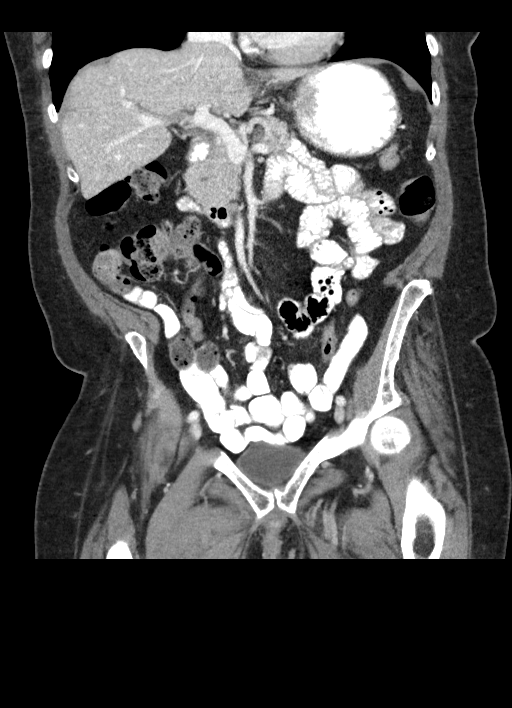
[im 47/85  soft-tissue]
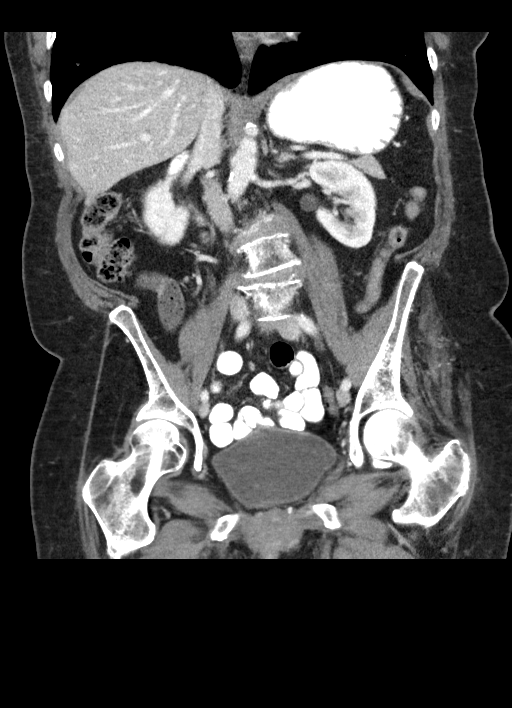

[16 of 46 positions shown; findings below may reference images not displayed]

FINDINGS: Lower chest: Right basilar scarring. Normal heart size without
pericardial or pleural effusion.

Hepatobiliary: Normal gallbladder, without biliary ductal
dilatation. Normal liver.

Pancreas: Normal, without mass or ductal dilatation.

Spleen: Normal in size, without focal abnormality.

Adrenals/Urinary Tract: Mild left adrenal nodularity. Normal right
adrenal gland. An upper pole left renal lesion measures slightly
greater than fluid density, including on image 22/series 2. On the
order of 11 mm. This is similar in size and T2 hyperintense on the
MRI of 01/25/2009, favoring a benign minimally complex cyst. Normal
right kidney, without hydronephrosis or hydroureter. pelvic floor
laxity with a cystocele

Stomach/Bowel: Normal stomach, without wall thickening. Rectal
prolapse. Normal small bowel.

Vascular/Lymphatic: Aortic and branch vessel atherosclerosis. No
abdominopelvic adenopathy.

Reproductive: Hysterectomy.  No adnexal mass.

Other: No significant free fluid.  Marked pelvic floor laxity.

Musculoskeletal: Lumbosacral spondylosis. Convex right lumbar spine
curvature.
IMPRESSION: 1.  No acute process or explanation for left-sided pain.
2. Marked pelvic floor laxity with cystocele and rectal prolapse.

## 2018-03-06 ENCOUNTER — Encounter: Payer: Self-pay | Admitting: Gastroenterology

## 2020-03-11 ENCOUNTER — Ambulatory Visit (HOSPITAL_COMMUNITY)
Admission: EM | Admit: 2020-03-11 | Discharge: 2020-03-11 | Disposition: A | Discharge status: Home / Self Care | Payer: Medicare Other | Attending: Family Medicine | Admitting: Family Medicine

## 2020-03-11 ENCOUNTER — Encounter (HOSPITAL_COMMUNITY): Payer: Self-pay

## 2020-03-11 ENCOUNTER — Other Ambulatory Visit: Payer: Self-pay

## 2020-03-11 DIAGNOSIS — R42 Dizziness and giddiness: Secondary | ICD-10-CM | POA: Diagnosis present

## 2020-03-11 DIAGNOSIS — R509 Fever, unspecified: Secondary | ICD-10-CM | POA: Insufficient documentation

## 2020-03-11 DIAGNOSIS — Z20822 Contact with and (suspected) exposure to covid-19: Secondary | ICD-10-CM | POA: Diagnosis present

## 2020-03-11 LAB — SARS CORONAVIRUS 2 (TAT 6-24 HRS): SARS Coronavirus 2: NEGATIVE

## 2020-03-11 MED ORDER — MECLIZINE HCL 25 MG PO TABS: 25.0000 mg | ORAL_TABLET | Freq: Three times a day (TID) | ORAL | 0 refills | Status: AC | PRN

## 2020-03-11 MED ORDER — ONDANSETRON HCL 4 MG PO TABS: 4.0000 mg | ORAL_TABLET | Freq: Three times a day (TID) | ORAL | 0 refills | Status: AC | PRN

## 2020-03-11 NOTE — ED Triage Notes (Signed)
Pt is here with dizziness that started this morning. Pt has a hx of vertigo.

## 2020-03-11 NOTE — Discharge Instructions (Signed)
The COVID test will be available within 24 hours Check My Chart for the test results Take the meclizine/antivert for vertigo Take the zofran/onandestron  for nausea Drink plenty of fluids Call for problems

## 2020-03-11 NOTE — ED Triage Notes (Signed)
Patient reports has felt this way at least two times in the past and medication she had at home was expired.   Denies pain.  Reports dizziness only.

## 2020-03-11 NOTE — ED Provider Notes (Signed)
[facility]    CSN: [id number] Arrival date & time: 03/11/20  1052      History   Chief Complaint Chief Complaint  Patient presents with  . Dizziness    HPI Katie Barker is a 79 y.o. female.   HPI   Here for dizziness Has had 2 or 3 times before Ran out of the vertigo medication usually kept at home This morning she woke up very dizzy.  With any movement she feels like the room is spinning.  She has had nausea but no vomiting. Denies any head injury. No trouble with vision or hearing.  No change in her usual level of weakness.  Good use of dexterity of her arms. She had some concerns about the dizziness.  She called EMS.  They came and did an evaluation.  She states that they did vital signs and an EKG.  She also had a blood sugar.  She states that they told her everything was fine, she did not need to go to the emergency room.  She is here for evaluation.  On the note from the EMS her BP was 174/108.  Pulse 116.  Respirations 16.  Temp 101.4.  Patient states she knew she had a temperature this morning so she took ibuprofen.  Her temperature here is normal.  The EKG showed sinus tachycardia, otherwise normal.  The EKG strip and her vital signs will be entered into the medical record.   Past Medical History:  Diagnosis Date  . Allergy   . Anemia, iron deficiency 09/07/2012   Labs from April 2013 include: HgB 9.7 MCV 64 RDW 17 (nl) Ferritin 7 Iron 14 % sat 14 TIBC 454 (high) Stool cards May 2013 negative x 3   . Anxiety 09/07/2012   Minimal benzo use. About 2 per month. Not on contract.   Marland Kitchen GERD (gastroesophageal reflux disease) 09/07/2012   Controlled on PPI PRN.   . HTN (hypertension) 09/07/2012   On triple drug (single pill) therapy. ARB, HCTZ, and amlodipine. BP well controlled at home. Elevated in MD's office.   . Hyperlipemia 09/07/2012   Her 10 yr CV risk is <5%. LDL goal is < 160. Has been on a statin since about 2003.    . Osteopenia 03/17/2014   Bone  density 01/2014 : dual femur -2.1, R forearm -0.7  . Overactive bladder 09/07/2012   Sees urology yearly. Sxs well controlled on Toviaz.   . 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 Upper airway cough syndrome 10/23/2015  . Rash and nonspecific skin eruption 08/07/2015  . Osteopenia 03/17/2014  . GERD (gastroesophageal reflux disease) 09/07/2012  . Anxiety 09/07/2012  . HTN (hypertension) 09/07/2012  . Hyperlipemia 09/07/2012  . Overactive bladder 09/07/2012  . 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 Anemia, iron deficiency 09/07/2012  . Health care maintenance 09/07/2012  . Constipation 09/07/2012    Past Surgical History:  Procedure Laterality Date  . CARPAL TUNNEL RELEASE     On the L wrist during her late 40's  . TOTAL ABDOMINAL HYSTERECTOMY W/ BILATERAL SALPINGOOPHORECTOMY  03/09/2000   Cervix was removed. Reason for TAH BSO was fibroids    OB History   No obstetric history on file.      Home Medications    Prior to Admission medications   Medication Sig Start Date End Date Taking? Authorizing Provider  ibuprofen (ADVIL) 800 MG tablet Take 800 mg by mouth every 8 (eight)  hours as needed.   Yes [provider]  Ascorbic Acid (VITAMIN C) 100 MG tablet Take 100 mg by mouth daily.    [provider]  aspirin 81 MG tablet Take 81 mg by mouth daily.    [provider]  cholecalciferol (VITAMIN D) 1000 units tablet Take 1,000 Units by mouth daily.    [provider]  diazepam (VALIUM) 2 MG tablet TAKE ONE TABLET BY MOUTH AS NEEDED FOR ANXIETY 01/26/17   Burns Spain, MD  EPINEPHrine (EPIPEN 2-PAK) 0.3 mg/0.3 mL IJ SOAJ injection Inject 0.3 mLs (0.3 mg total) into the muscle once. 06/06/16   Baxter Hire, MD  esomeprazole (NEXIUM) 40 MG capsule Take 40 mg by mouth daily at 12 noon.    [provider]  meclizine (ANTIVERT) 25 MG tablet Take 1 tablet (25 mg total) by mouth 3 (three)  times daily as needed for dizziness. 03/11/20   Eustace Moore, MD  Olmesartan-Amlodipine-HCTZ 20-5-12.5 MG TABS Take 1 tablet by mouth daily. 02/24/17   [provider]  ondansetron (ZOFRAN) 4 MG tablet Take 1-2 tablets (4-8 mg total) by mouth every 8 (eight) hours as needed for nausea or vomiting. 03/11/20   Eustace Moore, MD  TOVIAZ 8 MG TB24 tablet Take 8 mg by mouth daily.  11/25/16   [provider]  vitamin E 400 UNIT capsule Take 400 Units by mouth daily.    [provider]  dicyclomine (BENTYL) 10 MG capsule Take 1 tab twice daily as needed for abdominal pain and cramping. 01/22/17 03/11/20  Esterwood, Amy S, PA-C  diphenhydrAMINE (SOMINEX) 25 MG tablet Take 25 mg by mouth daily as needed for allergies or sleep. Reported on 06/06/2016  03/11/20  [provider]    Family History Family History  Problem Relation Age of Onset  . Asthma Daughter   . Allergic rhinitis Daughter   . Breast cancer Sister   . Angioedema Neg Hx   . Eczema Neg Hx   . Immunodeficiency Neg Hx   . Urticaria Neg Hx     Social History Social History   Tobacco Use  . Smoking status: Former Smoker    Types: Cigarettes    Quit date: 03/08/1975    Years since quitting: 45.0  . Smokeless tobacco: Never Used  Substance Use Topics  . Alcohol use: Yes    Alcohol/week: 0.0 standard drinks    Comment: socially  . Drug use: No     Allergies   Penicillins, Fluticasone, Omeprazole, Ranitidine, Lipitor [atorvastatin], and Pravastatin   Review of Systems Review of Systems  Neurological: Positive for dizziness. Negative for weakness, numbness and headaches.     Physical Exam Triage Vital Signs ED Triage Vitals  Enc Vitals Group     BP 03/11/20 1200 138/77     Pulse Rate 03/11/20 1200 96     Resp 03/11/20 1200 17     Temp 03/11/20 1200 98.7 F (37.1 C)     Temp Source 03/11/20 1200 Oral     SpO2 03/11/20 1200 98 %     Weight --      Height --      Head  Circumference --      Peak Flow --      Pain Score 03/11/20 1154 0     Pain Loc --      Pain Edu? --      Excl. in GC? --    No data found.  Updated Vital  Signs BP 138/77 (BP Location: Left Arm)   Pulse 96   Temp 98.7 F (37.1 C) (Oral)   Resp 17   LMP 06/15/1987   SpO2 98%      Physical Exam Constitutional:      General: She is not in acute distress.    Appearance: She is well-developed.     Comments: Patient is in motorized wheelchair.  She appears well.  HENT:     Head: Normocephalic and atraumatic.     Right Ear: Tympanic membrane, ear canal and external ear normal.     Left Ear: Tympanic membrane, ear canal and external ear normal.     Nose: Nose normal. No congestion.     Mouth/Throat:     Mouth: Mucous membranes are moist.     Pharynx: No posterior oropharyngeal erythema.  Eyes:     Conjunctiva/sclera: Conjunctivae normal.     Pupils: Pupils are equal, round, and reactive to light.  Cardiovascular:     Rate and Rhythm: Normal rate and regular rhythm.     Heart sounds: Normal heart sounds.  Pulmonary:     Effort: Pulmonary effort is normal. No respiratory distress.     Breath sounds: Normal breath sounds.  Musculoskeletal:        General: Normal range of motion.     Cervical back: Normal range of motion and neck supple.  Skin:    General: Skin is warm and dry.  Neurological:     General: No focal deficit present.     Mental Status: She is alert.  Psychiatric:        Mood and Affect: Mood normal.        Behavior: Behavior normal.      UC Treatments / Results  Labs (all labs ordered are listed, but only abnormal results are displayed) Labs Reviewed  SARS CORONAVIRUS 2 (TAT 6-24 HRS)    EKG   Radiology No results found.  Procedures Procedures (including critical care time)  Medications Ordered in UC Medications - No data to display  Initial Impression / Assessment and Plan / UC Course  I have reviewed the triage vital signs and the  nursing notes.  Pertinent labs & imaging results that were available during my care of the patient were reviewed by me and considered in my medical decision making (see chart for details).     First, patient has a classic vertigo.  She states that that she has had this before.  The spinning and the nausea is typical for her prior episodes.  They always go away within a couple of days.  She does not have any numbness weakness or signs of neurologic compromise to indicate a stroke or more serious diagnosis. Patient also has a fever of 101.4.  No known exposure to coronavirus.  She has not had her coronavirus vaccination.  I will get a coronavirus test just to be cautious.  She is told to quarantine until her test results are available. Final Clinical Impressions(s) / UC Diagnoses   Final diagnoses:  Dizziness  Fever, unspecified  Suspected COVID-19 virus infection     Discharge Instructions     The COVID test will be available within 24 hours Check My Chart for the test results Take the meclizine/antivert for vertigo Take the zofran/onandestron  for nausea Drink plenty of fluids Call for problems    ED Prescriptions    Medication Sig Dispense Auth. Provider   meclizine (ANTIVERT) 25 MG tablet Take 1 tablet (25 mg  total) by mouth 3 (three) times daily as needed for dizziness. 30 tablet Eustace Moore, MD   ondansetron (ZOFRAN) 4 MG tablet Take 1-2 tablets (4-8 mg total) by mouth every 8 (eight) hours as needed for nausea or vomiting. 20 tablet Eustace Moore, MD     PDMP not reviewed this encounter.   Eustace Moore, MD 03/11/20 [phone]

## 2020-07-23 ENCOUNTER — Other Ambulatory Visit: Payer: Self-pay

## 2020-07-23 ENCOUNTER — Ambulatory Visit (HOSPITAL_COMMUNITY)
Admission: EM | Admit: 2020-07-23 | Discharge: 2020-07-23 | Disposition: A | Discharge status: Home / Self Care | Payer: Medicare Other

## 2020-07-23 ENCOUNTER — Encounter (HOSPITAL_COMMUNITY): Payer: Self-pay

## 2020-07-23 DIAGNOSIS — J22 Unspecified acute lower respiratory infection: Secondary | ICD-10-CM

## 2020-07-23 DIAGNOSIS — R053 Chronic cough: Secondary | ICD-10-CM

## 2020-07-23 MED ORDER — HYDROCODONE-HOMATROPINE 5-1.5 MG/5ML PO SYRP: 5.0000 mL | ORAL_SOLUTION | Freq: Three times a day (TID) | ORAL | 0 refills | Status: DC | PRN

## 2020-07-23 MED ORDER — DOXYCYCLINE HYCLATE 100 MG PO CAPS: 100.0000 mg | ORAL_CAPSULE | Freq: Two times a day (BID) | ORAL | 0 refills | Status: AC

## 2020-07-23 NOTE — Discharge Instructions (Addendum)
If you develop symptoms of shortness of breath or chest tightness or persistent wheezing go immediately to the emergency department for evaluation.  Take all medicine as prescribed.  Remember cough syrup induces sedation therefore take with caution.

## 2020-07-23 NOTE — ED Triage Notes (Signed)
Pt is here with a cough that started last week, pt has taken NyQuil, home remdies to relieve discomfort.

## 2020-07-23 NOTE — ED Provider Notes (Signed)
[facility]    CSN: [id number] Arrival date & time: 07/23/20  1241      History   Chief Complaint Chief Complaint  Patient presents with  . Cough    HPI Katie Barker is a 79 y.o. female.  Patient presents with a cough x 1 week. Cough is not productive, however, she endorses the sensation of mucus in her chest. Last night cough became more persistent and kept her awake. Denies sinus congestion although endorses rhinorrhea intermittently. She refuses COVID-19 testing. She is not vaccinated. She is afebrile. Attempted relief with conservative therapies without improvement. Unaware of any sick contacts. Denies CP, SOB, dizziness, or HA  Past Medical History:  Diagnosis Date  . Allergy   . Anemia, iron deficiency 09/07/2012   Labs from April 2013 include: HgB 9.7 MCV 64 RDW 17 (nl) Ferritin 7 Iron 14 % sat 14 TIBC 454 (high) Stool cards May 2013 negative x 3   . Anxiety 09/07/2012   Minimal benzo use. About 2 per month. Not on contract.   Marland Kitchen GERD (gastroesophageal reflux disease) 09/07/2012   Controlled on PPI PRN.   . HTN (hypertension) 09/07/2012   On triple drug (single pill) therapy. ARB, HCTZ, and amlodipine. BP well controlled at home. Elevated in MD's office.   . Hyperlipemia 09/07/2012   Her 10 yr CV risk is <5%. LDL goal is < 160. Has been on a statin since about 2003.    . Osteopenia 03/17/2014   Bone density 01/2014 : dual femur -2.1, R forearm -0.7  . Overactive bladder 09/07/2012   Sees urology yearly. Sxs well controlled on Toviaz.   . 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 Upper airway cough syndrome 10/23/2015  . Rash and nonspecific skin eruption 08/07/2015  . Osteopenia 03/17/2014  . GERD (gastroesophageal reflux disease) 09/07/2012  . Anxiety 09/07/2012  . HTN (hypertension) 09/07/2012  . Hyperlipemia 09/07/2012  . Overactive bladder 09/07/2012  . 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 Anemia, iron  deficiency 09/07/2012  . Health care maintenance 09/07/2012  . Constipation 09/07/2012    Past Surgical History:  Procedure Laterality Date  . CARPAL TUNNEL RELEASE     On the L wrist during her late 40's  . TOTAL ABDOMINAL HYSTERECTOMY W/ BILATERAL SALPINGOOPHORECTOMY  03/09/2000   Cervix was removed. Reason for TAH BSO was fibroids    OB History   No obstetric history on file.      Home Medications    Prior to Admission medications   Medication Sig Start Date End Date Taking? Authorizing Provider  Ascorbic Acid (VITAMIN C) 100 MG tablet Take 100 mg by mouth daily.    [provider]  aspirin 81 MG tablet Take 81 mg by mouth daily.    [provider]  cholecalciferol (VITAMIN D) 1000 units tablet Take 1,000 Units by mouth daily.    [provider]  diazepam (VALIUM) 2 MG tablet TAKE ONE TABLET BY MOUTH AS NEEDED FOR ANXIETY 01/26/17   Burns Spain, MD  EPINEPHrine (EPIPEN 2-PAK) 0.3 mg/0.3 mL IJ SOAJ injection Inject 0.3 mLs (0.3 mg total) into the muscle once. 06/06/16   Baxter Hire, MD  esomeprazole (NEXIUM) 40 MG capsule Take 40 mg by mouth daily at 12 noon.    [provider]  fluconazole (DIFLUCAN) 100 MG tablet Take 100 mg by mouth daily. 06/11/20   [provider]  fluconazole (DIFLUCAN) 200 MG tablet  Take 200 mg by mouth daily. 02/21/20   [provider]  ibuprofen (ADVIL) 800 MG tablet Take 800 mg by mouth every 8 (eight) hours as needed.    [provider]  meclizine (ANTIVERT) 25 MG tablet Take 1 tablet (25 mg total) by mouth 3 (three) times daily as needed for dizziness. 03/11/20   Eustace Moore, MD  MYRBETRIQ 50 MG TB24 tablet Take 50 mg by mouth daily. 07/07/20   [provider]  Olmesartan-Amlodipine-HCTZ 20-5-12.5 MG TABS Take 1 tablet by mouth daily. 02/24/17   [provider]  ondansetron (ZOFRAN) 4 MG tablet Take 1-2 tablets (4-8 mg total) by mouth every 8 (eight) hours as  needed for nausea or vomiting. 03/11/20   Eustace Moore, MD  rosuvastatin (CRESTOR) 5 MG tablet Take 5 mg by mouth daily. 05/01/20   [provider]  TOVIAZ 8 MG TB24 tablet Take 8 mg by mouth daily.  11/25/16   [provider]  vitamin E 400 UNIT capsule Take 400 Units by mouth daily.    [provider]  dicyclomine (BENTYL) 10 MG capsule Take 1 tab twice daily as needed for abdominal pain and cramping. 01/22/17 03/11/20  Esterwood, Amy S, PA-C  diphenhydrAMINE (SOMINEX) 25 MG tablet Take 25 mg by mouth daily as needed for allergies or sleep. Reported on 06/06/2016  03/11/20  [provider]    Family History Family History  Problem Relation Age of Onset  . Asthma Daughter   . Allergic rhinitis Daughter   . Other Mother   . Other Father   . Breast cancer Sister   . Angioedema Neg Hx   . Eczema Neg Hx   . Immunodeficiency Neg Hx   . Urticaria Neg Hx     Social History Social History   Tobacco Use  . Smoking status: Former Smoker    Types: Cigarettes    Quit date: 03/08/1975    Years since quitting: 45.4  . Smokeless tobacco: Never Used  Substance Use Topics  . Alcohol use: Yes    Alcohol/week: 0.0 standard drinks    Comment: socially  . Drug use: No     Allergies   Penicillins, Fluticasone, Omeprazole, Ranitidine, Lipitor [atorvastatin], and Pravastatin   Review of Systems Review of Systems Pertinent negatives listed in HPI  Physical Exam Triage Vital Signs ED Triage Vitals  Enc Vitals Group     BP 07/23/20 1511 (!) 162/78     Pulse Rate 07/23/20 1511 100     Resp 07/23/20 1511 17     Temp 07/23/20 1511 99 F (37.2 C)     Temp Source 07/23/20 1511 Oral     SpO2 07/23/20 1511 99 %     Weight 07/23/20 1510 138 lb (62.6 kg)     Height --      Head Circumference --      Peak Flow --      Pain Score 07/23/20 1510 0     Pain Loc --      Pain Edu? --      Excl. in GC? --    No data found.  Updated Vital Signs BP (!) 162/78  (BP Location: Right Arm)   Pulse 100   Temp 99 F (37.2 C) (Oral)   Resp 17   Wt 138 lb (62.6 kg)   LMP 06/15/1987   SpO2 99%   BMI 26.07 kg/m   Visual Acuity Right Eye Distance:   Left Eye Distance:  Bilateral Distance:    Right Eye Near:   Left Eye Near:    Bilateral Near:     Physical Exam General appearance: alert, well developed, well nourished, cooperative and in no distress Head: Normocephalic, without obvious abnormality, atraumatic XNA:TFTD normal  rhinorrhea present, oropharynx normal Respiratory: Respirations even and unlabored, normal respiratory rate, coarse lung sound auscultated upper b/l bronchial Heart: rate and rhythm normal. No gallop or murmurs noted on exam  Neurological: Alert, oriented., symmetrical upper body movements, pt using a power wheelchair for mobility Skin: Skin color, texture, turgor normal. No rashes seen  Psych: Appropriate mood and affect.   UC Treatments / Results  Labs (all labs ordered are listed, but only abnormal results are displayed) Labs Reviewed - No data to display  EKG   Radiology No results found.  Procedures Procedures (including critical care time)  Medications Ordered in UC Medications - No data to display  Initial Impression / Assessment and Plan / UC Course  I have reviewed the triage vital signs and the nursing notes.  Pertinent labs & imaging results that were available during my care of the patient were reviewed by me and considered in my medical decision making (see chart for details).    Patient declined Covid testing. Given active medical history of reactive airway disease and high risk for pneumonia, will treat as lower respiratory infection. Doxycyline 100 mg BID x 7 days. For cough, hycodan cough syrup TID as needed for cough. Educated patient regarding sedative effects of medication. Red flags discussed that warrant further evaluation in the setting of the ER. An After Visit Summary was printed  and given to the patient/family. Precautions discussed. Red flags discussed. Questions invited and answered. They voiced understanding and agreement.   Final Clinical Impressions(s) / UC Diagnoses   Final diagnoses:  Lower respiratory infection (e.g., bronchitis, pneumonia, pneumonitis, pulmonitis)  Persistent cough     Discharge Instructions     If you develop symptoms of shortness of breath or chest tightness or persistent wheezing go immediately to the emergency department for evaluation.  Take all medicine as prescribed.  Remember cough syrup induces sedation therefore take with caution.    ED Prescriptions    Medication Sig Dispense Auth. Provider   HYDROcodone-homatropine (HYCODAN) 5-1.5 MG/5ML syrup Take 5 mLs by mouth 3 (three) times daily as needed for cough. 120 mL Bing Neighbors, FNP   doxycycline (VIBRAMYCIN) 100 MG capsule Take 1 capsule (100 mg total) by mouth 2 (two) times daily. 7 capsule Bing Neighbors, FNP     PDMP not reviewed this encounter.   Bing Neighbors, FNP 07/27/20 1126

## 2020-08-13 ENCOUNTER — Encounter (HOSPITAL_COMMUNITY): Payer: Self-pay | Admitting: Emergency Medicine

## 2020-08-13 ENCOUNTER — Other Ambulatory Visit: Payer: Self-pay

## 2020-08-13 ENCOUNTER — Ambulatory Visit (HOSPITAL_COMMUNITY)
Admission: EM | Admit: 2020-08-13 | Discharge: 2020-08-13 | Disposition: A | Discharge status: Home / Self Care | Payer: Medicare Other | Attending: Family Medicine | Admitting: Family Medicine

## 2020-08-13 DIAGNOSIS — R0982 Postnasal drip: Secondary | ICD-10-CM

## 2020-08-13 DIAGNOSIS — R05 Cough: Secondary | ICD-10-CM | POA: Diagnosis not present

## 2020-08-13 DIAGNOSIS — R059 Cough, unspecified: Secondary | ICD-10-CM

## 2020-08-13 MED ORDER — BENZONATATE 100 MG PO CAPS: 100.0000 mg | ORAL_CAPSULE | Freq: Three times a day (TID) | ORAL | 0 refills | Status: AC

## 2020-08-13 MED ORDER — CETIRIZINE HCL 5 MG PO TABS: 5.0000 mg | ORAL_TABLET | Freq: Every day | ORAL | 0 refills | Status: AC

## 2020-08-13 NOTE — Discharge Instructions (Addendum)
I have sent in Zyrtec for you to take daily  I have also sent in tessalon perles for you to take twice a day as needed for cough  Follow up with primary care or with this office as needed

## 2020-08-13 NOTE — ED Triage Notes (Addendum)
Cough started 3 days ago   Patient seen 7/26 for the same and did get well.  Patient believes cold air conditioner is partly to blame.    No fever.  Denies any other symptoms, just cough  "I feel good really"  covid test last week and was negative

## 2020-08-14 NOTE — ED Provider Notes (Signed)
[facility]   [id number] 08/13/20 Arrival Time: 1211   CC: COVID symptoms  SUBJECTIVE: History from: patient.  Katie Barker is a 79 y.o. female who presents with abrupt onset of nasal congestion, PND, and persistent dry cough for the last 2 weeks. Reports that she was seen in this office and treated with Doxycycline and cough syrup. Reports that she does feel better, but cannot seem to stop coughing. She is attributing this to the air conditioner in her apartment. Denies sick exposure to COVID, flu or strep. Denies recent travel. Has negative history of Covid. Has completed Covid vaccines. Has not taken OTC medications for this. There are no aggravating or alleviating factors. Denies previous symptoms in the past. Denies fever, chills, fatigue, sinus pain,  sore throat, SOB, wheezing, chest pain, nausea, changes in bowel or bladder habits.  Declines Covid testing today.  ROS: As per HPI.  All other pertinent ROS negative.     Past Medical History:  Diagnosis Date  . Allergy   . Anemia, iron deficiency 09/07/2012   Labs from April 2013 include: HgB 9.7 MCV 64 RDW 17 (nl) Ferritin 7 Iron 14 % sat 14 TIBC 454 (high) Stool cards May 2013 negative x 3   . Anxiety 09/07/2012   Minimal benzo use. About 2 per month. Not on contract.   Marland Kitchen GERD (gastroesophageal reflux disease) 09/07/2012   Controlled on PPI PRN.   . HTN (hypertension) 09/07/2012   On triple drug (single pill) therapy. ARB, HCTZ, and amlodipine. BP well controlled at home. Elevated in MD's office.   . Hyperlipemia 09/07/2012   Her 10 yr CV risk is <5%. LDL goal is < 160. Has been on a statin since about 2003.    . Osteopenia 03/17/2014   Bone density 01/2014 : dual femur -2.1, R forearm -0.7  . Overactive bladder 09/07/2012   Sees urology yearly. Sxs well controlled on Toviaz.   . 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 ABDOMINAL HYSTERECTOMY    . CARPAL TUNNEL RELEASE      On the L wrist during her late 40's  . TOTAL ABDOMINAL HYSTERECTOMY W/ BILATERAL SALPINGOOPHORECTOMY  03/09/2000   Cervix was removed. Reason for TAH BSO was fibroids   Allergies  Allergen Reactions  . Penicillins Other (See Comments)    Per pt took once and had syncope, itch, and was hospitalized.  Has patient had a PCN reaction causing immediate rash, facial/tongue/throat swelling, SOB or lightheadedness with hypotension: yes Has patient had a PCN reaction causing severe rash involving mucus membranes or skin necrosis: unknown Has patient had a PCN reaction that required hospitalization yes Has patient had a PCN reaction occurring within the last 10 years: no If all of the above answers are "NO", then may proceed with Cephalospor  . Fluticasone Other (See Comments)    Burned nose and eyes.  . Omeprazole Nausea Only    "makes me sick to my stomach"/Itching  . Ranitidine Nausea Only    "make me sick on my stomach"/Itching  . Lipitor [Atorvastatin] Other (See Comments)    HA  . Pravastatin Itching   No current facility-administered medications on file prior to encounter.   Current Outpatient Medications on File Prior to Encounter  Medication Sig Dispense Refill  . aspirin 81 MG tablet Take 81 mg by mouth daily.    . Olmesartan-Amlodipine-HCTZ 20-5-12.5 MG TABS Take 1 tablet by mouth daily.    . rosuvastatin (CRESTOR)  5 MG tablet Take 5 mg by mouth daily.    . TOVIAZ 8 MG TB24 tablet Take 8 mg by mouth daily.     . Ascorbic Acid (VITAMIN C) 100 MG tablet Take 100 mg by mouth daily.    . cholecalciferol (VITAMIN D) 1000 units tablet Take 1,000 Units by mouth daily.    . diazepam (VALIUM) 2 MG tablet TAKE ONE TABLET BY MOUTH AS NEEDED FOR ANXIETY 10 tablet 0  . doxycycline (VIBRAMYCIN) 100 MG capsule Take 1 capsule (100 mg total) by mouth 2 (two) times daily. 7 capsule 0  . EPINEPHrine (EPIPEN 2-PAK) 0.3 mg/0.3 mL IJ SOAJ injection Inject 0.3 mLs (0.3 mg total) into the muscle once.  2 Device 2  . esomeprazole (NEXIUM) 40 MG capsule Take 40 mg by mouth daily at 12 noon.    . fluconazole (DIFLUCAN) 100 MG tablet Take 100 mg by mouth daily.    . fluconazole (DIFLUCAN) 200 MG tablet Take 200 mg by mouth daily.    Marland Kitchen HYDROcodone-homatropine (HYCODAN) 5-1.5 MG/5ML syrup Take 5 mLs by mouth 3 (three) times daily as needed for cough. 120 mL 0  . ibuprofen (ADVIL) 800 MG tablet Take 800 mg by mouth every 8 (eight) hours as needed.    . meclizine (ANTIVERT) 25 MG tablet Take 1 tablet (25 mg total) by mouth 3 (three) times daily as needed for dizziness. 30 tablet 0  . MYRBETRIQ 50 MG TB24 tablet Take 50 mg by mouth daily.    . ondansetron (ZOFRAN) 4 MG tablet Take 1-2 tablets (4-8 mg total) by mouth every 8 (eight) hours as needed for nausea or vomiting. 20 tablet 0  . vitamin E 400 UNIT capsule Take 400 Units by mouth daily.    . [DISCONTINUED] dicyclomine (BENTYL) 10 MG capsule Take 1 tab twice daily as needed for abdominal pain and cramping. 60 capsule 2  . [DISCONTINUED] diphenhydrAMINE (SOMINEX) 25 MG tablet Take 25 mg by mouth daily as needed for allergies or sleep. Reported on 06/06/2016     Social History   Socioeconomic History  . Marital status: Divorced    Spouse name: Not on file  . Number of children: Not on file  . Years of education: Not on file  . Highest education level: Not on file  Occupational History  . Not on file  Tobacco Use  . Smoking status: Former Smoker    Types: Cigarettes    Quit date: 03/08/1975    Years since quitting: 45.4  . Smokeless tobacco: Never Used  Substance and Sexual Activity  . Alcohol use: Yes    Alcohol/week: 0.0 standard drinks    Comment: socially  . Drug use: No  . Sexual activity: Not Currently    Birth control/protection: Post-menopausal  Other Topics Concern  . Not on file  Social History Narrative   Lives alone in an apartment. Independent in ADL's. Drives. Has motorized WC for long distances. Otherwise, uses cane. On  disability for post-polio syndrome. Has mail delivered to door. Has permanent handicapped plate.    Social Determinants of Health   Financial Resource Strain:   . Difficulty of Paying Living Expenses:   Food Insecurity:   . Worried About Programme researcher, broadcasting/film/video in the Last Year:   . Barista in the Last Year:   Transportation Needs:   . Freight forwarder (Medical):   Marland Kitchen Lack of Transportation (Non-Medical):   Physical Activity:   . Days of Exercise per Week:   .  Minutes of Exercise per Session:   Stress:   . Feeling of Stress :   Social Connections:   . Frequency of Communication with Friends and Family:   . Frequency of Social Gatherings with Friends and Family:   . Attends Religious Services:   . Active Member of Clubs or Organizations:   . Attends Banker Meetings:   Marland Kitchen Marital Status:   Intimate Partner Violence:   . Fear of Current or Ex-Partner:   . Emotionally Abused:   Marland Kitchen Physically Abused:   . Sexually Abused:    Family History  Problem Relation Age of Onset  . Asthma Daughter   . Allergic rhinitis Daughter   . Other Mother   . Other Father   . Breast cancer Sister   . Angioedema Neg Hx   . Eczema Neg Hx   . Immunodeficiency Neg Hx   . Urticaria Neg Hx     OBJECTIVE:  Vitals:   08/13/20 1412  BP: 137/78  Pulse: 84  Resp: 18  Temp: 98.8 F (37.1 C)  TempSrc: Oral  SpO2: 96%     General appearance: alert; appears fatigued, but nontoxic; speaking in full sentences and tolerating own secretions HEENT: NCAT; Ears: EACs clear, TMs pearly gray; Eyes: PERRL.  EOM grossly intact. Sinuses: nontender; Nose: nares patent without rhinorrhea, Throat: oropharynx clear, tonsils non erythematous or enlarged, uvula midline, post nasal drip present Neck: supple without LAD Lungs: unlabored respirations, symmetrical air entry; cough: mild; no respiratory distress; CTAB Heart: regular rate and rhythm.  Radial pulses 2+ symmetrical bilaterally Skin:  warm and dry Psychological: alert and cooperative; normal mood and affect  LABS:  No results found for this or any previous visit (from the past 24 hour(s)).   ASSESSMENT & PLAN:  1. Cough   2. Postnasal drip     Meds ordered this encounter  Medications  . benzonatate (TESSALON) 100 MG capsule    Sig: Take 1 capsule (100 mg total) by mouth every 8 (eight) hours.    Dispense:  21 capsule    Refill:  0    Order Specific Question:   Supervising Provider    Answer:   Merrilee Jansky [id number]  . cetirizine (ZYRTEC) 5 MG tablet    Sig: Take 1 tablet (5 mg total) by mouth daily.    Dispense:  30 tablet    Refill:  0    Order Specific Question:   Supervising Provider    Answer:   Merrilee Jansky [[id number]]    Prescribed Zyrtec Prescribed tessalon perles Get plenty of rest and push fluids Use OTC flonase for nasal congestion and runny nose Use medications daily for symptom relief Use OTC medications like ibuprofen or tylenol as needed fever or pain  Call or go to the ED if you have any new or worsening symptoms such as fever, worsening cough, shortness of breath, chest tightness, chest pain, turning blue, changes in mental status.   Reviewed expectations re: course of current medical issues. Questions answered. Outlined signs and symptoms indicating need for more acute intervention. Patient verbalized understanding. After Visit Summary given.         Moshe Cipro, NP 08/14/20 [phone]

## 2021-01-08 ENCOUNTER — Other Ambulatory Visit: Payer: Self-pay | Admitting: Internal Medicine

## 2021-09-23 ENCOUNTER — Ambulatory Visit: Payer: Medicare Other | Admitting: Podiatry

## 2021-12-13 ENCOUNTER — Ambulatory Visit (HOSPITAL_COMMUNITY)
Admission: EM | Admit: 2021-12-13 | Discharge: 2021-12-13 | Disposition: A | Discharge status: Home / Self Care | Payer: Medicare Other

## 2021-12-13 ENCOUNTER — Other Ambulatory Visit: Payer: Self-pay

## 2021-12-13 DIAGNOSIS — L853 Xerosis cutis: Secondary | ICD-10-CM | POA: Diagnosis not present

## 2021-12-13 NOTE — ED Provider Notes (Signed)
[facility]    CSN: [id number] Arrival date & time: 12/13/21  [id number]      History   Chief Complaint Chief Complaint  Patient presents with   Leg Pain    HPI Katie Barker is a 80 y.o. female presenting with rash lower extremities for about 1 year. 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 Describes itchy dry skin x1 year. Has seen PCP for this and was prescribed triamcinolone, then derm who prescribed Betamethasone cream. States minimal improvement with these regimens. She does not use moisturizing cream.   HPI  Past Medical History:  Diagnosis Date   Allergy    Anemia, iron deficiency 09/07/2012   Labs from April 2013 include: HgB 9.7 MCV 64 RDW 17 (nl) Ferritin 7 Iron 14 % sat 14 TIBC 454 (high) Stool cards May 2013 negative x 3    Anxiety 09/07/2012   Minimal benzo use. About 2 per month. Not on contract.    GERD (gastroesophageal reflux disease) 09/07/2012   Controlled on PPI PRN.    HTN (hypertension) 09/07/2012   On triple drug (single pill) therapy. ARB, HCTZ, and amlodipine. BP well controlled at home. Elevated in MD's office.    Hyperlipemia 09/07/2012   Her 10 yr CV risk is <5%. LDL goal is < 160. Has been on a statin since about 2003.     Osteopenia 03/17/2014   Bone density 01/2014 : dual femur -2.1, R forearm -0.7   Overactive bladder 09/07/2012   Sees urology yearly. Sxs well controlled on Toviaz.    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    Patient Active Problem List   Diagnosis Date Noted   Upper airway cough syndrome 10/23/2015   Rash and nonspecific skin eruption 08/07/2015   Osteopenia 03/17/2014   GERD (gastroesophageal reflux disease) 09/07/2012   Anxiety 09/07/2012   HTN (hypertension) 09/07/2012   Hyperlipemia 09/07/2012   Overactive bladder 09/07/2012   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   Anemia, iron deficiency 09/07/2012   Health care maintenance 09/07/2012   Constipation 09/07/2012    Past Surgical  History:  Procedure Laterality Date   ABDOMINAL HYSTERECTOMY     CARPAL TUNNEL RELEASE     On the L wrist during her late 40's   TOTAL ABDOMINAL HYSTERECTOMY W/ BILATERAL SALPINGOOPHORECTOMY  03/09/2000   Cervix was removed. Reason for TAH BSO was fibroids    OB History   No obstetric history on file.      Home Medications    Prior to Admission medications   Medication Sig Start Date End Date Taking? Authorizing Provider  Ascorbic Acid (VITAMIN C) 100 MG tablet Take 100 mg by mouth daily.    [provider]  aspirin 81 MG tablet Take 81 mg by mouth daily.    [provider]  benzonatate (TESSALON) 100 MG capsule Take 1 capsule (100 mg total) by mouth every 8 (eight) hours. 08/13/20   Moshe CiproMatthews, Stephanie, NP  cetirizine (ZYRTEC) 5 MG tablet Take 1 tablet (5 mg total) by mouth daily. 08/13/20   Moshe CiproMatthews, Stephanie, NP  cholecalciferol (VITAMIN D) 1000 units tablet Take 1,000 Units by mouth daily.    [provider]  diazepam (VALIUM) 2 MG tablet TAKE ONE TABLET BY MOUTH AS NEEDED FOR ANXIETY 01/26/17   Burns SpainButcher, Elizabeth A, MD  doxycycline (VIBRAMYCIN) 100 MG capsule Take 1 capsule (100 mg total) by mouth 2 (two) times daily. 07/23/20   Bing NeighborsHarris, Kimberly S, FNP  EPINEPHrine (EPIPEN 2-PAK) 0.3 mg/0.3 mL IJ SOAJ injection  Inject 0.3 mLs (0.3 mg total) into the muscle once. 06/06/16   Baxter Hire, MD  esomeprazole (NEXIUM) 40 MG capsule Take 40 mg by mouth daily at 12 noon.    [provider]  fluconazole (DIFLUCAN) 100 MG tablet Take 100 mg by mouth daily. 06/11/20   [provider]  fluconazole (DIFLUCAN) 200 MG tablet Take 200 mg by mouth daily. 02/21/20   [provider]  HYDROcodone-homatropine (HYCODAN) 5-1.5 MG/5ML syrup Take 5 mLs by mouth 3 (three) times daily as needed for cough. 07/23/20   Bing Neighbors, FNP  ibuprofen (ADVIL) 800 MG tablet Take 800 mg by mouth every 8 (eight) hours as needed.    [provider]   meclizine (ANTIVERT) 25 MG tablet Take 1 tablet (25 mg total) by mouth 3 (three) times daily as needed for dizziness. 03/11/20   Eustace Moore, MD  MYRBETRIQ 50 MG TB24 tablet Take 50 mg by mouth daily. 07/07/20   [provider]  Olmesartan-Amlodipine-HCTZ 20-5-12.5 MG TABS Take 1 tablet by mouth daily. 02/24/17   [provider]  ondansetron (ZOFRAN) 4 MG tablet Take 1-2 tablets (4-8 mg total) by mouth every 8 (eight) hours as needed for nausea or vomiting. 03/11/20   Eustace Moore, MD  rosuvastatin (CRESTOR) 5 MG tablet Take 5 mg by mouth daily. 05/01/20   [provider]  TOVIAZ 8 MG TB24 tablet Take 8 mg by mouth daily.  11/25/16   [provider]  vitamin E 400 UNIT capsule Take 400 Units by mouth daily.    [provider]  dicyclomine (BENTYL) 10 MG capsule Take 1 tab twice daily as needed for abdominal pain and cramping. 01/22/17 03/11/20  Esterwood, Amy S, PA-C  diphenhydrAMINE (SOMINEX) 25 MG tablet Take 25 mg by mouth daily as needed for allergies or sleep. Reported on 06/06/2016  03/11/20  [provider]    Family History Family History  Problem Relation Age of Onset   Asthma Daughter    Allergic rhinitis Daughter    Other Mother    Other Father    Breast cancer Sister    Angioedema Neg Hx    Eczema Neg Hx    Immunodeficiency Neg Hx    Urticaria Neg Hx     Social History Social History   Tobacco Use   Smoking status: Former    Types: Cigarettes    Quit date: 03/08/1975    Years since quitting: 46.8   Smokeless tobacco: Never  Substance Use Topics   Alcohol use: Yes    Alcohol/week: 0.0 standard drinks    Comment: socially   Drug use: No     Allergies   Penicillins, Fluticasone, Omeprazole, Ranitidine, Lipitor [atorvastatin], and Pravastatin   Review of Systems Review of Systems  Skin:  Positive for rash.  All other systems reviewed and are negative.   Physical Exam Triage Vital Signs ED Triage  Vitals [12/13/21 1137]  Enc Vitals Group     BP (!) 172/96     Pulse Rate 78     Resp 16     Temp 98.7 F (37.1 C)     Temp Source Oral     SpO2 96 %     Weight      Height      Head Circumference      Peak Flow      Pain Score 0     Pain Loc      Pain Edu?  Excl. in [place]?    No data found.  Updated Vital Signs BP (!) 172/96 (BP Location: Left Arm)    Pulse 78    Temp 98.7 F (37.1 C) (Oral)    Resp 16    LMP 06/15/1987    SpO2 96%   Visual Acuity Right Eye Distance:   Left Eye Distance:   Bilateral Distance:    Right Eye Near:   Left Eye Near:    Bilateral Near:     Physical Exam Vitals reviewed.  Constitutional:      General: She is not in acute distress.    Appearance: Normal appearance. She is not ill-appearing.  HENT:     Head: Normocephalic and atraumatic.  Pulmonary:     Effort: Pulmonary effort is normal.  Skin:    Comments: See image below Dry scaly skin bilateral LEs, L>R. Trace varicose veins L ankle, without distension or point tenderness. No calf tenderness or swelling.  Neurological:     General: No focal deficit present.     Mental Status: She is alert and oriented to person, place, and time.  Psychiatric:        Mood and Affect: Mood normal.        Behavior: Behavior normal.        Thought Content: Thought content normal.        Judgment: Judgment normal.      UC Treatments / Results  Labs (all labs ordered are listed, but only abnormal results are displayed) Labs Reviewed - No data to display  EKG   Radiology No results found.  Procedures Procedures (including critical care time)  Medications Ordered in UC Medications - No data to display  Initial Impression / Assessment and Plan / UC Course  I have reviewed the triage vital signs and the nursing notes.  Pertinent labs & imaging results that were available during my care of the patient were reviewed by me and considered in my medical decision making (see chart for  details).     This patient is a very pleasant 80 y.o. year old female presenting with dry skin dermatitis. Has been followed by derm for this. Continue Betamethasone prn as prescribed by derm, and add an emollient moisturizer. F/u with derm for ongoing concerns..   Final Clinical Impressions(s) / UC Diagnoses   Final diagnoses:  Dry skin dermatitis     Discharge Instructions      -Continue your steroid cream as directed by dermatology -Try a thick moisturizer like aquaphor -Follow-up with your dermatologist for ongoing skin concerns, and your PCP for chronic conditions   ED Prescriptions   None    PDMP not reviewed this encounter.   Hazel Sams, PA-C 12/13/21 1207

## 2021-12-13 NOTE — ED Triage Notes (Signed)
Pt presents with c/o L leg rash X 8 months. States she has seen a Doctor for the problem and was she is prescribed is not helping.

## 2021-12-13 NOTE — Discharge Instructions (Addendum)
-  Continue your steroid cream as directed by dermatology -Try a thick moisturizer like aquaphor -Follow-up with your dermatologist for ongoing skin concerns, and your PCP for chronic conditions

## 2022-01-09 ENCOUNTER — Other Ambulatory Visit: Payer: Self-pay | Admitting: Pain Medicine

## 2022-01-09 DIAGNOSIS — M5416 Radiculopathy, lumbar region: Secondary | ICD-10-CM

## 2022-01-28 ENCOUNTER — Ambulatory Visit
Admission: RE | Admit: 2022-01-28 | Discharge: 2022-01-28 | Disposition: A | Discharge status: Home / Self Care | Payer: Medicare Other | Source: Ambulatory Visit | Attending: Pain Medicine | Admitting: Pain Medicine

## 2022-01-28 ENCOUNTER — Other Ambulatory Visit: Payer: Self-pay

## 2022-01-28 DIAGNOSIS — M5416 Radiculopathy, lumbar region: Secondary | ICD-10-CM

## 2022-08-28 ENCOUNTER — Ambulatory Visit: Payer: Medicare Other | Admitting: Physical Therapy

## 2022-09-30 ENCOUNTER — Ambulatory Visit: Payer: Medicare Other | Admitting: Physical Therapy

## 2022-11-12 ENCOUNTER — Ambulatory Visit: Payer: Medicare Other | Attending: Internal Medicine

## 2022-11-12 DIAGNOSIS — R262 Difficulty in walking, not elsewhere classified: Secondary | ICD-10-CM | POA: Diagnosis not present

## 2022-11-12 DIAGNOSIS — R2689 Other abnormalities of gait and mobility: Secondary | ICD-10-CM | POA: Diagnosis present

## 2022-11-12 DIAGNOSIS — R2681 Unsteadiness on feet: Secondary | ICD-10-CM | POA: Diagnosis present

## 2022-11-12 DIAGNOSIS — M6281 Muscle weakness (generalized): Secondary | ICD-10-CM | POA: Diagnosis present

## 2022-11-12 NOTE — Therapy (Signed)
OUTPATIENT PHYSICAL THERAPY WHEELCHAIR EVALUATION   Patient Name: Katie Barker MRN: [id number] DOB:[birth year], 81 y.o., female Today's Date: 11/12/2022   PT End of Session - 11/12/22 1357     Visit Number 1    Number of Visits 1    Authorization Type [employer]    PT Start Time 1404    PT Stop Time 1453    PT Time Calculation (min) 49 min    Equipment Utilized During Treatment Gait belt    Activity Tolerance Patient tolerated treatment well;Patient limited by pain    Behavior During Therapy [facility] for tasks assessed/performed             Past Medical History:  Diagnosis Date   Allergy    Anemia, iron deficiency 09/07/2012   Labs from April 2013 include: HgB 9.7 MCV 64 RDW 17 (nl) Ferritin 7 Iron 14 % sat 14 TIBC 454 (high) Stool cards May 2013 negative x 3    Anxiety 09/07/2012   Minimal benzo use. About 2 per month. Not on contract.    GERD (gastroesophageal reflux disease) 09/07/2012   Controlled on PPI PRN.    HTN (hypertension) 09/07/2012   On triple drug (single pill) therapy. ARB, HCTZ, and amlodipine. BP well controlled at home. Elevated in MD's office.    Hyperlipemia 09/07/2012   Her 10 yr CV risk is <5%. LDL goal is < 160. Has been on a statin since about 2003.     Osteopenia 03/17/2014   Bone density 01/2014 : dual femur -2.1, R forearm -0.7   Overactive bladder 09/07/2012   Sees urology yearly. Sxs well controlled on Toviaz.    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 Patient reporting that she needs a chair that tilts to help manage LE swelling.   PRECAUTIONS: Fall  WEIGHT BEARING RESTRICTIONS No   OCCUPATION: retired  PLOF: Independent and Requires assistive device for independence  PATIENT GOALS to get a new chair     MEDICAL HISTORY:  Primary diagnosis onset: unspecified Diagnosis  Code: R60.9 Diagnosis: Edema, unsepcified    Diagnosis code:       Diagnosis: PostPolio syndrome G14 Diagnosis  Code:  Diagnosis:   [] Progressive disease  Relevant future surgeries:     Height: 5'1" Weight: 134# Explain recent changes or trends in weight:      History:  Past Medical History:  Diagnosis Date   Allergy    Anemia, iron deficiency 09/07/2012   Labs from April 2013 include: HgB 9.7 MCV 64 RDW 17 (nl) Ferritin 7 Iron 14 % sat 14 TIBC 454 (high) Stool cards May 2013 negative x 3    Anxiety 09/07/2012   Minimal benzo use. About 2 per month. Not on contract.    GERD (gastroesophageal reflux disease) 09/07/2012   Controlled on PPI PRN.    HTN (hypertension) 09/07/2012   On triple drug (single pill) therapy. ARB, HCTZ, and amlodipine. BP well controlled at home. Elevated in MD's  office.    Hyperlipemia 09/07/2012   Her 10 yr CV risk is <5%. LDL goal is < 160. Has been on a statin since about 2003.     Osteopenia 03/17/2014   Bone density 01/2014 : dual femur -2.1, R forearm -0.7   Overactive bladder 09/07/2012   Sees urology yearly. Sxs well controlled on Toviaz.    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            Cardio Status:  Functional Limitations:   [x] Intact  []  Impaired      Respiratory Status:  Functional Limitations:   [x] Intact  [] Impaired   [] SOB [] COPD [] O2 Dependent ______LPM  [] Ventilator Dependent  Resp equip:                                                     Objective Measure(s):   Orthotics:   [] Amputee:                                                             [] Prosthesis:      HOME ENVIRONMENT:  [] House [] Condo/town home [x] Apartment [] Asst living [] LTCF         [x] Own  [] Rent   [x] Lives alone [] Lives with others -                             Hours without assistance: 24  [x] Home is accessible to patient                                 Storage of wheelchair:  [x] In home   [] Other Comments:       COMMUNITY :  TRANSPORTATION:  [] Car [x] Van [] Public Transportation [] Adapted w/c Lift []  Ambulance [] Other:                     []   Sits in wheelchair during transport   Where is w/c stored during transport? 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        Other:  COMMUNICATION:  Verbal Communication  WFL receptive WFL expressive Understandable  Difficult to understand  non-communicative  Primary Language:______english________ 2nd:_____________  Communication provided by:[x] Patient Family Caregiver Translator   Uses an Paramedic device     Manufacturer/Model :     MOBILITY/BALANCE:  Sitting Balance  Standing Balance  Transfers  Ambulation   WFL      WFL   Independent   Independent   Uses UE for balance in sitting Comments:  Uses UE/device for stability Comments:   Min assist   Ambulates independently with       device:___________________       Mod assist   Able to ambulate ______ feet        safely/functionally/independently    Min assist   Min assist   Max assist   Non-functional ambulator         History/High risk of falls    Mod assist   Mod assist   Dependent   Unable to ambulate    Max  assist   Max assist  Transfer method:[] 1 person 2 person sliding board squat pivot stand pivot mechanical patient lift  other:    Unable   Unable    Fall History: # of falls in the past 6 months? 0 # of "near" falls in the past 6 months? Multiple near falls a week     CURRENT SEATING / MOBILITY:  Current Mobility Device: None Cane/Walker Manual Dependent Dependent w/ Tilt rScooter  Power (type of control): Group 2  Manufacturer:  Model: J6 Serial #:   Size: 18"x 18" Color:  Age:   Purchased by whom:   Current condition of mobility base:  fair  Current seating system:                                                                       Age of seating system:  8 years  Describe posture in present seating system:    Is the current mobility meeting medical necessity?:  Yes No Describe: Her current power wheelchair does not have a tilt feature and thus is unable to assist her with managing her pitting edema.        Ability to complete Mobility-Related Activities of Daily Living (MRADL's) with Current Mobility Device:   Move room to room  Independent  Min Mod Max assist  Unable  Comments:   Meal prep  Independent  Min Mod Max assist  Unable    Feeding  Independent  Min Mod Max assist  Unable    Bathing  Independent  Min Mod Max assist  Unable    Grooming  Independent  Min Mod Max assist  Unable    UE dressing  Independent   Min Mod Max assist  Unable    LE dressing  Independent   Min Mod Max assist  Unable    Toileting  Independent  Min Mod Max  assist  [] Unable    Bowel Mgt: [x]  Continent []  Incontinent []  Accidents []  Diapers []  Colostomy []  Bowel Program:  Bladder Mgt: []  Continent []  Incontinent []  Accidents []  Diapers []  Urinal [x]  Intermittent Cath []  Indwelling Cath []  Supra-pubic Cath     Current Mobility Equipment Trialed/ Ruled Out:    Does not meet mobility needs due to:    Loraine Leriche all boxes that indicate inability to use the specific equipment listed     Meets needs for safe  independent functional  ambulation  / mobility    Risk of  Falling or History of Falls    Enviromental limitations      Cognition    Safety concerns with  physical ability    Decreased / limitations endurance  & strength     Decreased / limitations  motor skills  & coordination    Pain    Pace /  Speed    Cardiac and/or  respiratory condition    Contra - indicated by diagnosis   Cane/Crutches  []   [x]   []   []   [x]   [x]   [x]   [x]   [x]   []   []    Walker / Rollator  []  NA   []   [x]   []   []   [x]   [x]   [x]   [x]   [x]   []   []     Manual Wheelchair [id number]:  []  NA  []   []   []   []   [x]   [x]   [x]   [x]   [x]   []   []    Manual W/C (K0005) with power assist  [x]  NA  []   []   []   []   []   []   []   []   []   []   []    Scooter  [x]  NA  []   []   []   []   []   []   []   []   []   []   []    Power Wheelchair: standard joystick  []  NA  [x]   []   []   []   []   []   []   []   []   []   []    Power Wheelchair: alternative controls  []  NA  []   []   []   []   []   []   []   []   []   []   []    Summary:  The least costly alternative for independent functional mobility was found to be:    []  Crutch/Cane  []  Walker []  Manual w/c  []  Manual w/c with power assist   []  Scooter   [x]  Power w/c std joystick   []  Power w/c alternative control        []  Requires dependent care mobility device   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 for safe mobility equipment operation  [x]   Yes []   No  Patient is willing and motivated to use recommended mobility equipment  [x]   Yes []   No       []  Patient is unable to safely operate mobility equipment independently and requires dependent care equipment Comments:           SENSATION and SKIN ISSUES:  Sensation [x]  Intact  []  Impaired []  Absent []  Hyposensate []  Hypersensate  []  Defensiveness  Location(s) of impairment: reports intermittent burning sensation in B LE R >L  Reports intermittent  Pressure Relief Method(s):  [x]  Lean side to side to offload (without risk of falling)  []   W/C push up (4+ times/hour for 15+ seconds) []  Stand up (without risk of falling)    []   Other: (Describe): Effective pressure relief method(s) above can be performed consistently throughout the day: Yes  r No If not, Why?:  Skin Integrity Risk:       []  Low risk           [x]  Moderate risk            []  High risk  If high risk, explain:   Skin Issues/Skin Integrity  Current skin Issues  []  Yes [x]  No [x]  Intact  []   Red area   []   Open area  []  Scar tissue  [x]  At risk from prolonged sitting  Where: ischial tubes History of Skin Issues  []  Yes [x]  No Where : When: Stage: Hx of skin flap surgeries  []  Yes [x]  No Where:  When:  Pain: [x]  Yes []  No   Pain Location(s): low back, R shoulder  Intensity scale: (0-10) : 9 How does pain interfere with mobility and/or MRADLs? - unable to stand for longer than ~5s due to increase in low back pain; unable to utilize R UE excessively with MRADLs due to pain   MAT EVALUATION:  Neuro-Muscular Status: (Tone, Reflexive, Responses, etc.)     []   Intact   []  Spasticity:  [x]  Hypotonicity  []  Fluctuating  []  Muscle Spasms  [x]  Poor Righting Reactions/Poor Equilibrium Reactions  []  Primal Reflex(s):    Comments:            COMMENTS:    POSTURE:     Comments:  Pelvis Anterior/Posterior:  []  Neutral   [x]   Posterior  []  Anterior  []  Fixed - No movement [x]  Tendency away from neutral [x]  Flexible [x]  Self-correction [x]  External correction Obliquity (viewed from front)  [x]  WFL []  R Obliquity []  L Obliquity  []  Fixed - No movement []  Tendency away from neutral []  Flexible []  Self-correction []  External correction Rotation  [x]  WFL []  R anterior []  L anterior  []  Fixed - No movement []  Tendency away from neutral []  Flexible []  Self-correction []  External correction Tonal Influence Pelvis:  []  Normal []  Flaccid [x]  Low tone []  Spasticity []  Dystonia []  Pelvis thrust []  Other:    Trunk Anterior/Posterior:  []  WFL [x]  Thoracic kyphosis []  Lumbar lordosis  []  Fixed - No movement [x]  Tendency away from neutral [x]  Flexible [x]  Self-correction [x]  External correction  [x]  WFL []  Convex to left  []  Convex to right []  S-curve   []  C-curve []  Multiple curves []  Tendency away from neutral []  Flexible []  Self-correction []  External correction Rotation of shoulders and upper trunk:  [x]  Neutral []  Left-anterior []  Right- anterior []  Fixed- no movement []  Tendency away from neutral []  Flexible []  Self correction []  External correction Tonal influence Trunk:  [x]  Normal []  Flaccid [x]  Low tone []  Spasticity []  Dystonia []  Other:   Head & Neck  [x]  Functional []  Flexed    []  Extended []  Rotated right  []  Rotated left []  Laterally flexed right []  Laterally flexed left []  Cervical hyperextension   [x]  Good head control []  Adequate head control []  Limited head control []  Absent head control Describe tone/movement of head and neck:    Lower Extremity Measurements: LE MMT:  MMT Right 11/12/2022 Left 11/12/2022  Hip flexion 3+ 3+  Hip extension    Hip abduction 3+ 0  Hip adduction 4 4  Knee flexion 2 2  Knee extension 2 2  Ankle dorsiflexion 0 2  Ankle plantarflexion 0 0   (Blank rows = not tested)  Hip  positions:   Neutral    Abducted     Adducted   Subluxed    Dislocated    Fixed    Tendency away from neutral  Flexible  Self-correction  External correction   Hip Windswept:[x]  Neutral   Right     Left   Subluxed    Dislocated    Fixed    Tendency away from neutral  Flexible  Self-correction  External correction  LE Tone:  Normal  Low tone  Spasticity  Flaccid  Dystonia  Rocks/Extends at hip  Thrust into knee extension  Pushes legs downward into footrest  Foot positioning: ROM Concerns: Dorsiflexed:  Right    Left Plantar flexed:  Right     Left Inversion:  Right     Left Eversion:  Right     Left  LE Edema:  1+ (Barely detectable impression when finger is pressed into skin)  2+ (slight indentation. 15 seconds to rebound)  3+ (deeper indentation. 30 seconds to rebound)  4+ (>30 seconds to rebound)  UE Measurements:   UPPER EXTREMITY MMT:  MMT Right 11/12/2022 Left 11/12/2022  Shoulder flexion 2 (painful) 2  Shoulder abduction 2+ 2+  Shoulder adduction    Elbow flexion 2+ 2+  Elbow extension 2+ 2+  Wrist flexion    Wrist extension    Pinch strength    Grip strength    (Blank rows = not tested)  Shoulder Posture:  Right Tendency towards Left    Functional      Elevation      Depression      Protraction      Retraction      Internal rotation      External rotation      Subluxed     UE Tone:  Normal  Flaccid  Low tone  Spasticity   Dystonia  Other:   UE Edema:  1+ (Barely detectable impression when finger is pressed into skin)  2+ (slight indentation. 15 seconds to rebound)  3+ (deeper indentation. 30 seconds to rebound)  4+ (>30 seconds to rebound)  Wrist/Hand: Handedness:  Right    Left    NA: Comments:  Right  Left    WNL      Limitations      Contractures      Fisting      Tremors      Weak  grasp      Poor dexterity      Hand movement non functional      Paralysis         [facility] PT Assessment - 11/12/22 0001       Standardized Balance Assessment   Standardized Balance Assessment Five Times Sit to Stand    Five times sit to stand comments  1'06" to complete 4 STS              MOBILITY BASE RECOMMENDATIONS and JUSTIFICATION:  MOBILITY BASE  JUSTIFICATION   Manufacturer:   [employer]  Model:           J6                   Color: red Seat Width:  18" Seat Depth  18"    Manual mobility base (continue below)    Scooter/POV   Power mobility base   Number of hours per day spent in above selected mobility base: ~10 hours to complete MRADLs   Typical daily mobility base use Schedule: transferring into chair to complete MRADLs  safely and efficiently   [x]  is not a safe, functional ambulator  [x]  limitation prevents from completing a MRADL(s) within a reasonable time frame    [x]  limitation places at high risk of morbidity or mortality secondary to  the attempts to perform a    MRADL(s)  [x]  limitation prevents accomplishing a MRADL(s) entirely  [x]  provide independent mobility  [x]  equipment is a lifetime medical need  [x]  walker or cane inadequate  [x]  any type manual wheelchair      inadequate  [x]  scooter/POV inadequate      []  requires dependent mobility         POWER MOBILITY      []  Scooter/POV    []  can safely operate   []  can safely transfer   []  has adequate trunk stability   []  cannot functionally propel  manual wheelchair    [x]  Power mobility base    [x]  non-ambulatory   [x]  cannot functionally propel manual wheelchair   [x]  cannot functionally and safely      operate scooter/POV  [x]  can safely operate power       wheelchair  [x]  home is accessible  [x]  willing to use power wheelchair     Tilt  [x]  Powered tilt on powered chair  []  Powered tilt on manual chair  []  Manual tilt on manual chair Comments:  [x]  change  position for pressure      [x]  elief/cannot weight shift   []  change position against      gravitational force on head and      shoulders   [x]  decrease pain  []  blood pressure management   []  control autonomic dysreflexia  []  decrease respiratory distress  []  management of spasticity  [x]  management of low tone  [x]  facilitate postural control   [x]  rest periods   [x]  control edema  [x]  increase sitting tolerance   []  aid with transfers     Recline   []  Power recline on power chair  []  Manual recline on manual chair  Comments:    []  intermittent catheterization  []  manage spasticity  []  accommodate femur to back angle  []  change position for pressure relief/cannot weight shift rhigh risk of pressure sore development  []  tilt alone does not accomplish     effective pressure relief, maximum pressure relief achieved at -      _______ degrees tilt   _______ degrees recline   []  difficult to transfer to and from bed []  rest periods and sleeping in chair  []  repositioning for transfers  []  bring to full recline for ADL care  []  clothing/diaper changes in chair  []  gravity PEG tube feeding  []  head positioning  []  decrease pain  []  blood pressure management   []  control autonomic dysreflexia  []  decrease respiratory distress  []  user on ventilator     Elevator on mobility base  []  Power wheelchair  []  Scooter  []  increase Indep in transfers   []  increase Indep in ADLs    []  bathroom function and safety  []  kitchen/cooking function and safety  []  shopping  []  raise height for communication at standing level  []  raise height for eye contact which reduces cervical neck strain and pain  []  drive at raised height for safety and navigating crowds  []  Other:   []  Vertical position system  (anterior tilt)     (Drive locks-out)    []  Stand       (Drive enabled)  []   independent weight bearing   decrease joint contractures   decrease/manage spasticity   decrease/manage  spasms   pressure distribution away from   scapula, sacrum, coccyx, and ischial tuberosity   increase digestion and elimination    access to counters and cabinets   increase reach   increase interaction with others at eye level, reduces neck strain   increase performance of       MRADL(s)      Power elevating legrest     Center mount (Single) 85-170 degrees        Standard (Pair) 100-170 degrees   position legs at 90 degrees, not available with std power ELR   center mount tucks into chair to decrease turning radius in home, not available with std power ELR   provide change in position for LE   elevate legs during recline     maintain placement of feet on      footplate   decrease edema   improve circulation   actuator needed to elevate legrest   actuator needed to articulate legrest preventing knees from flexing   Increase ground clearance over      curbs    STD (pair) independently                     elevate legrest   POWER WHEELCHAIR CONTROLS      Controls/input device   Expandable   Non-expandable   Proportional   Right Hand  Left Hand   Non-proportional/switches/head-array   Electrical/proximity           Mechanical      Manufacturer:___________________   Type:________________________  provides access for controlling wheelchair   programming for accurate control   progressive disease/changing condition   required for alternative drive      controls        lacks motor control to operate  proportional drive control   unable to understand proportional controls   limited movement/strength   extraneous movement / tremors / ataxic / spastic        Upgraded electronics controller/harness     Single power (tilt or recline)    Expandable     Non-expandable plus    Multi-power (tilt, recline, power legrest, power seat lift, vertical positioning system, stand)   allows input device to  communicate with drive motors   harness provides necessary connections between the controller, input device, and seat functions      needed in order to operate power seat functions through joystick/ input device   required for alternative drive controls      Enhanced display   required to connect all alternative drive controls    required for upgraded joystick      (lite-throw, heavy duty, micro)   Allows user to see in which mode and drive the wheelchair is set; necessary for alternate controls        Upgraded tracking electronics   correct tracking when on uneven surfaces makes switch driving more efficient and less fatiguing   increase safety when driving   increase ability to traverse thresholds     Safety / reset / mode switches     Type:     Used to change modes and stop the wheelchair when driving      Mount for joystick / input device/switches   swing away for access or transfers    attaches joystick / input device / switches to wheelchair    provides for consistent access    midline for optimal placement    []  Attendant controlled joystick plus     mount  []  safety  []  long distance driving  []  operation of seat functions  []  compliance with transportation regulations    [x]  Battery  [x]  required to power (power assist / scooter/ power wc / other):   []  Power inverter (24V to 12V)  []  required for ventilator / respiratory equipment / other:     CHAIR OPTIONS MANUAL & POWER      Armrests   [x]  adjustable height []  removable  []  swing away []  fixed  [x]  flip back  []  reclining  [x]  full length pads []  desk []  tube arms []  gel pads  [x]  provide support with elbow at 90    [x]  remove/flip back/swing away for  transfers  [x]  provide support and positioning of upper body    []  allow to come closer to table top  [x]  remove for access to tables  []  provide support for w/c tray  [x]  change of height/angles for       variable activities   []   Elbow support / Elbow stop  []  keep elbow positioned on arm pad  []  keep arms from falling off arm pad  during tilt and/or recline   Upper Extremity Support  []  Arm trough  []   R  []   L  Style:  []  swivel mount []  fixed mount   []  posterior hand support  []   tray  []  full tray  []  joystick cut out  []   R  []   L  Style:  []  decrease gravitational pull on      shoulders  []  provide support to increase UE  function  []  provide hand support in natural    position  []  position flaccid UE  []  decrease subluxation    []  decrease edema       []  manage spasticity   []  provide midline positioning  []  provide work surface  []  placement for AAC/ Computer/ EADL       Hangers/ Legrests   []  ______ degree  []  Elevating []  articulating  []  swing away []  fixed []  lift off  []  heavy duty [x]  adjustable knee angle  []  adjustable calf panel   []  longer extension tube              []  provide LE support  []  maintain placement of feet on      footplate   []  accommodate lower leg length  []  accommodate to hamstring       tightness  []  enable transfers  []  provide change in position for LE's  []  elevate legs during recline    []  decrease edema  []  durability      Foot support   [x]  footplate [x]  R [x]  L [x]  flip up           []  Depth adjustable   [x]  angle adjustable  [x]  foot board/one piece    [x]  provide foot support  [x]  accommodate to ankle ROM  [x]  allow foot to go under wheelchair base  [x]  enable transfers     []  Shoe holders  []  position foot    []  decrease / manage spasticity  []  control position of LE  []  stability    []  safety     []  Ankle strap/heel      loops  []  support foot on foot support  []  decrease extraneous movement  []  provide input to heel   []   protect foot      Amputee adapter  R   L     Style:                  Size:   Provide support for stump/residual extremity     Transportation tie-down   to provide crash tested tie-down brackets      Crutch/cane holder     O2 holder     IV hanger    Ventilator tray/mount     stabilize accessory on wheelchair       Component  Justification      Seat cushion     Zen SP   accommodate impaired sensation   decubitus ulcers present or history   unable to shift weight   increase pressure distribution   prevent pelvic extension   custom required "off-the-shelf"    seat cushion will not accommodate deformity   stabilize/promote pelvis alignment   stabilize/promote femur alignment   accommodate obliquity   accommodate multiple deformity   incontinent/accidents   low maintenance      seat mounts                  fixed  removable   attach seat platform/cushion to wheelchair frame     Seat wedge     provide increased aggressiveness of seat shape to decrease sliding  down in the seat   accommodate ROM         Cover replacement    protect back or seat cushion   incontinent/accidents     Solid seat / insert     support cushion to prevent      hammocking   allows attachment of cushion to mobility base     Lateral pelvic/thigh/hip     support (Guides)      decrease abduction   accommodate pelvis   position upper legs   accommodate spasticity   removable for transfers      Lateral pelvic/thigh      supports mounts   fixed    swing-away    removable   mounts lateral pelvic/thigh supports      mounts lateral pelvic/thigh supports swing-away or removable for transfers     Medial thigh support (Pommel)  decrease adduction  accommodate ROM   remove for transfers    alignment       Medial thigh    fixed      support mounts       swing-away    removable   mounts medial thigh supports    Mounts medial supports swing- away or removable for transfers       Component  Justification    Back    Sport Back     provide posterior trunk support  facilitate tone   provide  lumbar/sacral support  accommodate deformity   support trunk in midline    custom required "off-the-shelf" back support will not accommodate deformity    provide lateral trunk support  accommodate or decrease tone             Back mounts   fixed   removable   attach back rest/cushion to wheelchair frame    Lateral trunk      supports   R  L   decrease lateral trunk leaning   accommodate asymmetry     contour for increased contact   safety     control of tone     Lateral trunk      supports  mounts  []  fixed  []  swing-away   []  removable  []  mounts lateral trunk supports     []  Mounts lateral trunk supports swing-away or removable for transfers   []  Anterior chest      strap, vest     []  decrease forward movement of shoulder  []  decrease forward movement of trunk  []  safety/stability  []  added abdominal support  []  trunk alignment  []  assistance with shoulder control   []  decrease shoulder elevation    [x]  Headrest      [x]  provide posterior head support  []  provide posterior neck support  []  provide lateral head support  []  provide anterior head support  [x]  support during tilt and recline  []  improve feeding     []  improve respiration  []  placement of switches  [x]  safety    []  accommodate ROM   []  accommodate tone  []  improve visual orientation   [x]  Headrest           []  fixed [x]  removable []  flip down      Mounting hardware   []  swing-away laterals/switches  [x]  mount headrest   [x]  mounts headrest flip down or  removable for transfers  []  mount headrest swing-away laterals   []  mount switches     []  Neck Support    []  decrease neck rotation  []  decrease forward neck flexion   Pelvic Positioner    [x]  std hip belt          []  padded hip belt  []  dual pull hip belt  []  four point hip belt  []  stabilize tone  [x]  decrease falling out of chair  []  prevent excessive extension  []  special pull angle to control      rotation  []   pad for protection over boney   prominence  []  promote comfort    []  Essential needs        bag/pouch   []  medicines []  special food rorthotics []  clothing changes  []  diapers  []  catheter/hygiene []  ostomy supplies   The above equipment has a life- long use expectancy.  Growth and changes in medical and/or functional conditions would be the exceptions.   SUMMARY:  Why mobility device was selected; include why a lower level device is not appropriate:   ASSESSMENT:  CLINICAL IMPRESSION: Patient is a 81 y.o. female who was seen today for physical therapy evaluation a new custom power wheelchair. Her need for a power wheelchair is lifelong and necessary in order for her to complete any and all MRADLs in a safe and pain- free manner. 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 Five times Sit to Stand Test (FTSS) Method: Use a straight back chair with a solid seat that is 17-18" high. Ask participant to sit on the chair with arms folded across their chest.   Instructions: "Stand up and sit down as quickly as possible 5 times, keeping your arms folded across your chest."   Measurement: Stop timing when the participant touches the chair in sitting the 5th time.  TIME: 66 seconds and only completing 4 transfers due to fatigue/increase in pain  Cut off scores indicative of increased fall risk: >12 sec CVA, >16 sec PD, >13 sec vestibular (ANPTA Core Set of Outcome Measures for Adults with Neurologic Conditions, 2018).  When standing/transferring, patient predominantly uses her left lower extremity and puts essentially no weight through her right lower extremity. Recently,  her left lower extremity has become more painful and weaker, therefore severely impacting her ability to complete her MRADLs.  She requires power tilt on her Quantum J6 custom power wheelchair to both manage her lower extremity edema as well as help manage her  pain. With increased lower extremity edema, her already weakened lower extremities will have an even more difficult time advancing when ambulating or transferring, therefore further increasing her already high risk for falling. When he pain is not properly managed, she is also very limited in how she can safely maneuver. A headrest that is removable will be necessary for patient safety while using the tilt feature as well.  Non-expandable right upper extremity control and mount is necessary for the patient be able to safely reach the controls of the wheelchair and operate her wheelchair independently. Single power upgraded electronics are necessary for the patient to be able to utilize the power tilt feature that is essential to help her manage both her pain and her bilateral lower extremity edema. A battery will, therefore, also be necessary in order for the patient to operate the Quantum J6 custom power wheelchair that is essential for her to complete any and all of her MRADLs.  The patient will require adjustable height, flip-back, full-length arm rests to support her upper extremities, but also be removed for safe transfers in and out of the wheelchair. Adjustable height will assure that patients arms are maintained at the appropriate angle to minimize strain on her shoulders, which are greatly required in order for her stand up from her wheelchair due to her profound lower extremity weakness.  An adjustable knee, angle adjustable, flip up footplate with a foot board is necessary to support the patients lower extremities while she uses her Quantum J6 custom power wheelchair. The angle adjustable feature will help minimize the chance of the patient developing a plantarflexion contracture in her right lower extremity due to tibialis anterior weakness. The foot plate must be able to flip up to assure safe transfers in and out of the wheelchair.  A Zen SP cushion will provide patient with the appropriate amount  of pressure distribution to minimize her risk for developing pressure injuries. She is chair-bound and spends a majority of her waking hours in her power wheelchair and has limited safe ability to complete full pressure reliefs on a routine schedule due to her lower extremity weakness and fluctuations in the severity of her pain. A Sport Back will provide posterior support to the patient while she safely uses the Quantum J6 custom power wheelchair to complete her MRADLs. And a standard hip belt will assure that the patient remains safely positioned and seated while using the wheelchair.  The patients need for a Quantum J6 custom power wheelchair is a lifelong need. She is considered a non-functional ambulator and is at an incredibly high risk for falling and suffering grave sequelae. Her diagnosis of Post- Polio syndrome has with profound lower extremity weakness and fluctuating severity of pain only increasing with increased mobility. A Quantum J6 custom power wheelchair will allow the patient to safely access her environment and complete her MRADLs as safely, efficiently and independently as she can.   OBJECTIVE IMPAIRMENTS Abnormal gait, decreased activity tolerance, decreased coordination, decreased endurance, decreased knowledge of use of DME, decreased mobility, difficulty walking, decreased strength, increased edema, impaired sensation, impaired tone, impaired UE functional use, and pain.   ACTIVITY LIMITATIONS carrying, lifting, bending, sitting, standing, squatting, stairs, transfers, toileting, reach over head, locomotion level, and  caring for others  PARTICIPATION LIMITATIONS: meal prep, cleaning, interpersonal relationship, driving, shopping, community activity, yard work, and church  PERSONAL FACTORS Age, Past/current experiences, Sex, Time since onset of injury/illness/exacerbation, Transportation, and 1 comorbidity: post-polio syndrome  are also affecting patient's functional outcome.    REHAB POTENTIAL: Fair time since onset  CLINICAL DECISION MAKING: Stable/uncomplicated  EVALUATION COMPLEXITY: High                                   GOALS: One time visit. No goals established.    PLAN: PT FREQUENCY: one time visit    Westley Foots, PT Westley Foots, PT, DPT, CBIS  11/12/2022, 3:38 PM    I concur with the above findings and recommendations of the therapist:  Physician name printed:         Physician's signature:      Date:

## 2023-05-04 ENCOUNTER — Emergency Department (HOSPITAL_COMMUNITY): Payer: 59

## 2023-05-04 ENCOUNTER — Emergency Department (HOSPITAL_COMMUNITY)
Admission: EM | Admit: 2023-05-04 | Discharge: 2023-05-04 | Disposition: A | Discharge status: Home / Self Care | Payer: 59 | Attending: Emergency Medicine | Admitting: Emergency Medicine

## 2023-05-04 ENCOUNTER — Encounter (HOSPITAL_COMMUNITY): Payer: Self-pay

## 2023-05-04 ENCOUNTER — Other Ambulatory Visit: Payer: Self-pay

## 2023-05-04 DIAGNOSIS — I10 Essential (primary) hypertension: Secondary | ICD-10-CM | POA: Insufficient documentation

## 2023-05-04 DIAGNOSIS — Z7982 Long term (current) use of aspirin: Secondary | ICD-10-CM | POA: Insufficient documentation

## 2023-05-04 DIAGNOSIS — M6289 Other specified disorders of muscle: Secondary | ICD-10-CM | POA: Insufficient documentation

## 2023-05-04 DIAGNOSIS — R1032 Left lower quadrant pain: Secondary | ICD-10-CM | POA: Diagnosis not present

## 2023-05-04 DIAGNOSIS — Z79899 Other long term (current) drug therapy: Secondary | ICD-10-CM | POA: Diagnosis not present

## 2023-05-04 LAB — CBC WITH DIFFERENTIAL/PLATELET
Abs Immature Granulocytes: 0.01 10*3/uL (ref 0.00–0.07)
Basophils Absolute: 0 10*3/uL (ref 0.0–0.1)
Basophils Relative: 1 %
Eosinophils Absolute: 0.2 10*3/uL (ref 0.0–0.5)
Eosinophils Relative: 3 %
HCT: 34.5 % — ABNORMAL LOW (ref 36.0–46.0)
Hemoglobin: 10.3 g/dL — ABNORMAL LOW (ref 12.0–15.0)
Immature Granulocytes: 0 %
Lymphocytes Relative: 13 %
Lymphs Abs: 0.8 10*3/uL (ref 0.7–4.0)
MCH: 22.7 pg — ABNORMAL LOW (ref 26.0–34.0)
MCHC: 29.9 g/dL — ABNORMAL LOW (ref 30.0–36.0)
MCV: 76 fL — ABNORMAL LOW (ref 80.0–100.0)
Monocytes Absolute: 0.4 10*3/uL (ref 0.1–1.0)
Monocytes Relative: 7 %
Neutro Abs: 4.8 10*3/uL (ref 1.7–7.7)
Neutrophils Relative %: 76 %
Platelets: 387 10*3/uL (ref 150–400)
RBC: 4.54 MIL/uL (ref 3.87–5.11)
RDW: 15.6 % — ABNORMAL HIGH (ref 11.5–15.5)
WBC: 6.2 10*3/uL (ref 4.0–10.5)
nRBC: 0 % (ref 0.0–0.2)

## 2023-05-04 LAB — URINALYSIS, ROUTINE W REFLEX MICROSCOPIC
Bilirubin Urine: NEGATIVE
Glucose, UA: NEGATIVE mg/dL
Ketones, ur: NEGATIVE mg/dL
Nitrite: NEGATIVE
Protein, ur: NEGATIVE mg/dL
Specific Gravity, Urine: 1.006 (ref 1.005–1.030)
pH: 6 (ref 5.0–8.0)

## 2023-05-04 LAB — COMPREHENSIVE METABOLIC PANEL
ALT: 14 U/L (ref 0–44)
AST: 16 U/L (ref 15–41)
Albumin: 3.5 g/dL (ref 3.5–5.0)
Alkaline Phosphatase: 59 U/L (ref 38–126)
Anion gap: 9 (ref 5–15)
BUN: 11 mg/dL (ref 8–23)
CO2: 24 mmol/L (ref 22–32)
Calcium: 9.1 mg/dL (ref 8.9–10.3)
Chloride: 100 mmol/L (ref 98–111)
Creatinine, Ser: 0.63 mg/dL (ref 0.44–1.00)
GFR, Estimated: >60 mL/min (ref >60)
Glucose, Bld: 97 mg/dL (ref 70–99)
Potassium: 3.2 mmol/L — ABNORMAL LOW (ref 3.5–5.1)
Sodium: 133 mmol/L — ABNORMAL LOW (ref 135–145)
Total Bilirubin: 0.4 mg/dL (ref 0.3–1.2)
Total Protein: 6.6 g/dL (ref 6.5–8.1)

## 2023-05-04 LAB — LIPASE, BLOOD: Lipase: 30 U/L (ref 11–51)

## 2023-05-04 MED ORDER — POTASSIUM CHLORIDE CRYS ER 20 MEQ PO TBCR
40.0000 meq | EXTENDED_RELEASE_TABLET | Freq: Once | ORAL | Status: AC
  Administered 2023-05-04: 40 meq via ORAL
  Filled 2023-05-04: qty 2

## 2023-05-04 MED ORDER — TRAMADOL HCL 50 MG PO TABS: 50.0000 mg | ORAL_TABLET | Freq: Four times a day (QID) | ORAL | 0 refills | Status: AC | PRN

## 2023-05-04 MED ORDER — OXYCODONE-ACETAMINOPHEN 5-325 MG PO TABS
1.0000 | ORAL_TABLET | Freq: Once | ORAL | Status: AC
  Administered 2023-05-04: 1 via ORAL
  Filled 2023-05-04: qty 1

## 2023-05-04 NOTE — ED Triage Notes (Addendum)
Pt to ED with complaint of left flank pain x1 month. Performs self-catheterizations. Denies fevers.   Seen by PCP 4/16 and given macrobid and pyridium for suspected UTI - pt reports medications are not helping.

## 2023-05-04 NOTE — Discharge Instructions (Addendum)
For your pain, you may take up to 1000mg  of acetaminophen (tylenol) 4 times daily for up to a week. This is the maximum dose of acetminophen (tylenol) you can take from all sources. Please check other over-the-counter medications and prescriptions to ensure you are not taking other medications that contain acetaminophen.  You may also take ibuprofen 400 mg 6 times a day OR 600mg  4 times a day alternating with or at the same time as tylenol.  Take tramadol as needed for breakthrough pain.  This medication can be addicting, sedating and cause constipation.

## 2023-05-04 NOTE — ED Notes (Signed)
Patient transported to CT 

## 2023-05-04 NOTE — ED Provider Notes (Signed)
[place] EMERGENCY DEPARTMENT AT [facility] Provider Note   CSN: [id number] Arrival date & time: 05/04/23  1022     History {Add pertinent medical, surgical, social history, OB history to HPI:1} Chief Complaint  Patient presents with   Flank Pain    Katie Barker is a 82 y.o. female.  HPI     82yo female with history of hyperlipidemia, hypertension, lumbar radiculopathy who presents with concern for left abdominal pain.  She performs self catheterizations at home with last cath this morning.  Given macrobid 4/16 with suspected UTI. Does not feel like it is helping.  Reports symptoms began over a month ago.   Denies nausea, vomiting, fever, back pain  Has burning pain to LLQ, worse after self catheterization, worse with walking, worse when lifting left leg  No chest pain or dyspnea. No diarrhea. Has chronic constipation and takes laxatives. Hx of hemorrhoids no recent bright red blood per rectum.   Past Medical History:  Diagnosis Date   Allergy    Anemia, iron deficiency 09/07/2012   Labs from April 2013 include: HgB 9.7 MCV 64 RDW 17 (nl) Ferritin 7 Iron 14 % sat 14 TIBC 454 (high) Stool cards May 2013 negative x 3    Anxiety 09/07/2012   Minimal benzo use. About 2 per month. Not on contract.    GERD (gastroesophageal reflux disease) 09/07/2012   Controlled on PPI PRN.    HTN (hypertension) 09/07/2012   On triple drug (single pill) therapy. ARB, HCTZ, and amlodipine. BP well controlled at home. Elevated in MD's office.    Hyperlipemia 09/07/2012   Her 10 yr CV risk is <5%. LDL goal is < 160. Has been on a statin since about 2003.     Osteopenia 03/17/2014   Bone density 01/2014 : dual femur -2.1, R forearm -0.7   Overactive bladder 09/07/2012   Sees urology yearly. Sxs well controlled on Toviaz.    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     Home Medications Prior to Admission medications   Medication Sig Start Date End Date Taking? Authorizing Provider   Ascorbic Acid (VITAMIN C) 100 MG tablet Take 100 mg by mouth daily.    [provider]  aspirin 81 MG tablet Take 81 mg by mouth daily.    [provider]  benzonatate (TESSALON) 100 MG capsule Take 1 capsule (100 mg total) by mouth every 8 (eight) hours. 08/13/20   Moshe Cipro, NP  cetirizine (ZYRTEC) 5 MG tablet Take 1 tablet (5 mg total) by mouth daily. 08/13/20   Moshe Cipro, NP  cholecalciferol (VITAMIN D) 1000 units tablet Take 1,000 Units by mouth daily.    [provider]  diazepam (VALIUM) 2 MG tablet TAKE ONE TABLET BY MOUTH AS NEEDED FOR ANXIETY 01/26/17   Burns Spain, MD  doxycycline (VIBRAMYCIN) 100 MG capsule Take 1 capsule (100 mg total) by mouth 2 (two) times daily. 07/23/20   Bing Neighbors, NP  EPINEPHrine (EPIPEN 2-PAK) 0.3 mg/0.3 mL IJ SOAJ injection Inject 0.3 mLs (0.3 mg total) into the muscle once. 06/06/16   Baxter Hire, MD  esomeprazole (NEXIUM) 40 MG capsule Take 40 mg by mouth daily at 12 noon.    [provider]  fluconazole (DIFLUCAN) 100 MG tablet Take 100 mg by mouth daily. 06/11/20   [provider]  fluconazole (DIFLUCAN) 200 MG tablet Take 200 mg by mouth daily. 02/21/20   [provider]  HYDROcodone-homatropine (HYCODAN) 5-1.5 MG/5ML syrup Take 5 mLs  by mouth 3 (three) times daily as needed for cough. 07/23/20   Bing Neighbors, NP  ibuprofen (ADVIL) 800 MG tablet Take 800 mg by mouth every 8 (eight) hours as needed.    [provider]  meclizine (ANTIVERT) 25 MG tablet Take 1 tablet (25 mg total) by mouth 3 (three) times daily as needed for dizziness. 03/11/20   Eustace Moore, MD  MYRBETRIQ 50 MG TB24 tablet Take 50 mg by mouth daily. 07/07/20   [provider]  Olmesartan-Amlodipine-HCTZ 20-5-12.5 MG TABS Take 1 tablet by mouth daily. 02/24/17   [provider]  ondansetron (ZOFRAN) 4 MG tablet Take 1-2 tablets (4-8 mg total) by mouth every 8 (eight)  hours as needed for nausea or vomiting. 03/11/20   Eustace Moore, MD  rosuvastatin (CRESTOR) 5 MG tablet Take 5 mg by mouth daily. 05/01/20   [provider]  TOVIAZ 8 MG TB24 tablet Take 8 mg by mouth daily.  11/25/16   [provider]  vitamin E 400 UNIT capsule Take 400 Units by mouth daily.    [provider]  dicyclomine (BENTYL) 10 MG capsule Take 1 tab twice daily as needed for abdominal pain and cramping. 01/22/17 03/11/20  Esterwood, Amy S, PA-C  diphenhydrAMINE (SOMINEX) 25 MG tablet Take 25 mg by mouth daily as needed for allergies or sleep. Reported on 06/06/2016  03/11/20  [provider]      Allergies    Penicillins, Fluticasone, Omeprazole, Ranitidine, Lipitor [atorvastatin], and Pravastatin    Review of Systems   Review of Systems  Physical Exam Updated Vital Signs BP (!) 144/83   Pulse 86   Temp 98.3 F (36.8 C) (Oral)   Resp 17   LMP 06/15/1987   SpO2 96%  Physical Exam  ED Results / Procedures / Treatments   Labs (all labs ordered are listed, but only abnormal results are displayed) Labs Reviewed  CBC WITH DIFFERENTIAL/PLATELET - Abnormal; Notable for the following components:      Result Value   Hemoglobin 10.3 (*)    HCT 34.5 (*)    MCV 76.0 (*)    MCH 22.7 (*)    MCHC 29.9 (*)    RDW 15.6 (*)    All other components within normal limits  COMPREHENSIVE METABOLIC PANEL - Abnormal; Notable for the following components:   Sodium 133 (*)    Potassium 3.2 (*)    All other components within normal limits  URINALYSIS, ROUTINE W REFLEX MICROSCOPIC - Abnormal; Notable for the following components:   Hgb urine dipstick SMALL (*)    Leukocytes,Ua TRACE (*)    Bacteria, UA RARE (*)    All other components within normal limits  LIPASE, BLOOD    EKG None  Radiology No results found.  Procedures Procedures  {Document cardiac monitor, telemetry assessment procedure when appropriate:1}  Medications Ordered in  ED Medications - No data to display  ED Course/ Medical Decision Making/ A&P   {   Click here for ABCD2, HEART and other calculatorsREFRESH Note before signing :1}                            82yo female with history of hyperlipidemia, hypertension, lumbar radiculopathy who presents with concern for left abdominal pain.  Labs completed and personally evaluated and interpreted by me show no leukocytosis, no significant anemia (stable), mild hypokalemia.  No sign of pancreatitis or hepatitis. UA without signs of  infection.      {Document critical care time when appropriate:1} {Document review of labs and clinical decision tools ie heart score, Chads2Vasc2 etc:1}  {Document your independent review of radiology images, and any outside records:1} {Document your discussion with family members, caretakers, and with consultants:1} {Document social determinants of health affecting pt's care:1} {Document your decision making why or why not admission, treatments were needed:1} Final Clinical Impression(s) / ED Diagnoses Final diagnoses:  None    Rx / DC Orders ED Discharge Orders     None

## 2023-05-04 NOTE — ED Provider Triage Note (Cosign Needed Addendum)
Emergency Medicine Provider Triage Evaluation Note  Katie Barker , a 82 y.o. female  was evaluated in triage.  Pt complains of left lower abdominal pain.  Patient notes that she performs self catheterizations at home with her last catheterization began this morning x 2.  Was evaluated by her primary care provider on 4/16 and given a prescription for Macrobid and Pyridium for suspected UTI.  Patient notes that medications are not helping.  Has had constipation with her last bowel movement being 2 days ago. Denies nausea, vomiting, flank pain.  Review of Systems  Positive:  Negative:   Physical Exam  BP (!) 144/83   Pulse 86   Temp 98.3 F (36.8 C) (Oral)   Resp 17   LMP 06/15/1987   SpO2 96%  Gen:   Awake, no distress   Resp:  Normal effort  MSK:   Moves extremities without difficulty  Other:  No appreciable abdominal tenderness to palpation.  No CVA tenderness to palpation noted.  Medical Decision Making  Medically screening exam initiated at 11:00 AM.  Appropriate orders placed.  Katie Barker was informed that the remainder of the evaluation will be completed by another provider, this initial triage assessment does not replace that evaluation, and the importance of remaining in the ED until their evaluation is complete.  Workup initiated   Katie Barker A, PA-C 05/04/23 1105    Katie Barker A, PA-C 05/04/23 1112

## 2023-10-21 ENCOUNTER — Encounter: Payer: Self-pay | Admitting: Podiatry

## 2023-10-21 ENCOUNTER — Ambulatory Visit (INDEPENDENT_AMBULATORY_CARE_PROVIDER_SITE_OTHER): Payer: 59 | Admitting: Podiatry

## 2023-10-21 DIAGNOSIS — M25371 Other instability, right ankle: Secondary | ICD-10-CM | POA: Diagnosis not present

## 2023-10-21 NOTE — Progress Notes (Signed)
Subjective:  Patient ID: Katie Barker, female    DOB: 1941-06-05,  MRN: [id number]  Chief Complaint  Patient presents with   Foot Pain    PATIENT STATES THAT HER RIGHT ANKLE IS VERY PAINFUL AND HER TOES ARE GOING TO THE SIDE, SHE TRY'S TO STAY OFF IT A LOT , IT HURTS TO WALK AND SHE WILL LIKE TO KNOW IF SHE CAN GET A BRACE FOR JUST THE RIGTH FOOT OR A SHOE SO HER FOOT WILL NOT BE LENDING OVER    82 y.o. female presents with the above complaint.  Patient presents with right ankle instability that has been going for quite some time.  She states that her right ankle is very painful she is tries to stay off of.  Hurts to walk she sometimes falls over.  She would like to know if she can do some kind of bracing.  She has not seen anyone else prior to seeing me.   Review of Systems: Negative except as noted in the HPI. Denies N/V/F/Ch.  Past Medical History:  Diagnosis Date   Allergy    Anemia, iron deficiency 09/07/2012   Labs from April 2013 include: HgB 9.7 MCV 64 RDW 17 (nl) Ferritin 7 Iron 14 % sat 14 TIBC 454 (high) Stool cards May 2013 negative x 3    Anxiety 09/07/2012   Minimal benzo use. About 2 per month. Not on contract.    GERD (gastroesophageal reflux disease) 09/07/2012   Controlled on PPI PRN.    HTN (hypertension) 09/07/2012   On triple drug (single pill) therapy. ARB, HCTZ, and amlodipine. BP well controlled at home. Elevated in MD's office.    Hyperlipemia 09/07/2012   Her 10 yr CV risk is <5%. LDL goal is < 160. Has been on a statin since about 2003.     Osteopenia 03/17/2014   Bone density 01/2014 : dual femur -2.1, R forearm -0.7   Overactive bladder 09/07/2012   Sees urology yearly. Sxs well controlled on Toviaz.    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    Current Outpatient Medications:    Ascorbic Acid (VITAMIN C) 100 MG tablet, Take 100 mg by mouth daily., Disp: , Rfl:    aspirin 81 MG tablet, Take 81 mg by mouth daily., Disp: , Rfl:    benzonatate (TESSALON) 100 MG  capsule, Take 1 capsule (100 mg total) by mouth every 8 (eight) hours., Disp: 21 capsule, Rfl: 0   cetirizine (ZYRTEC) 5 MG tablet, Take 1 tablet (5 mg total) by mouth daily., Disp: 30 tablet, Rfl: 0   cholecalciferol (VITAMIN D) 1000 units tablet, Take 1,000 Units by mouth daily., Disp: , Rfl:    diazepam (VALIUM) 2 MG tablet, TAKE ONE TABLET BY MOUTH AS NEEDED FOR ANXIETY, Disp: 10 tablet, Rfl: 0   doxycycline (VIBRAMYCIN) 100 MG capsule, Take 1 capsule (100 mg total) by mouth 2 (two) times daily., Disp: 7 capsule, Rfl: 0   EPINEPHrine (EPIPEN 2-PAK) 0.3 mg/0.3 mL IJ SOAJ injection, Inject 0.3 mLs (0.3 mg total) into the muscle once., Disp: 2 Device, Rfl: 2   esomeprazole (NEXIUM) 40 MG capsule, Take 40 mg by mouth daily at 12 noon., Disp: , Rfl:    fluconazole (DIFLUCAN) 100 MG tablet, Take 100 mg by mouth daily., Disp: , Rfl:    fluconazole (DIFLUCAN) 200 MG tablet, Take 200 mg by mouth daily., Disp: , Rfl:    ibuprofen (ADVIL) 800 MG tablet, Take 800 mg by mouth every 8 (eight) hours as needed., Disp: ,  Rfl:    meclizine (ANTIVERT) 25 MG tablet, Take 1 tablet (25 mg total) by mouth 3 (three) times daily as needed for dizziness., Disp: 30 tablet, Rfl: 0   MYRBETRIQ 50 MG TB24 tablet, Take 50 mg by mouth daily., Disp: , Rfl:    Olmesartan-Amlodipine-HCTZ 20-5-12.5 MG TABS, Take 1 tablet by mouth daily., Disp: , Rfl:    ondansetron (ZOFRAN) 4 MG tablet, Take 1-2 tablets (4-8 mg total) by mouth every 8 (eight) hours as needed for nausea or vomiting., Disp: 20 tablet, Rfl: 0   rosuvastatin (CRESTOR) 5 MG tablet, Take 5 mg by mouth daily., Disp: , Rfl:    TOVIAZ 8 MG TB24 tablet, Take 8 mg by mouth daily. , Disp: , Rfl:    traMADol (ULTRAM) 50 MG tablet, Take 1 tablet (50 mg total) by mouth every 6 (six) hours as needed., Disp: 15 tablet, Rfl: 0   vitamin E 400 UNIT capsule, Take 400 Units by mouth daily., Disp: , Rfl:   Social History   Tobacco Use  Smoking Status Former   Current packs/day:  0.00   Types: Cigarettes   Quit date: 03/08/1975   Years since quitting: 48.6  Smokeless Tobacco Never    Allergies  Allergen Reactions   Penicillins Other (See Comments)    Per pt took once and had syncope, itch, and was hospitalized.  Has patient had a PCN reaction causing immediate rash, facial/tongue/throat swelling, SOB or lightheadedness with hypotension: yes Has patient had a PCN reaction causing severe rash involving mucus membranes or skin necrosis: unknown Has patient had a PCN reaction that required hospitalization yes Has patient had a PCN reaction occurring within the last 10 years: no If all of the above answers are "NO", then may proceed with Cephalospor   Fluticasone Other (See Comments)    Burned nose and eyes.   Omeprazole Nausea Only    "makes me sick to my stomach"/Itching   Ranitidine Nausea Only    "make me sick on my stomach"/Itching   Lipitor [Atorvastatin] Other (See Comments)    HA   Pravastatin Itching   Objective:  There were no vitals filed for this visit. There is no height or weight on file to calculate BMI. Constitutional Well developed. Well nourished.  Vascular Dorsalis pedis pulses palpable bilaterally. Posterior tibial pulses palpable bilaterally. Capillary refill normal to all digits.  No cyanosis or clubbing noted. Pedal hair growth normal.  Neurologic Normal speech. Oriented to person, place, and time. Epicritic sensation to light touch grossly present bilaterally.  Dermatologic Nails well groomed and normal in appearance. No open wounds. No skin lesions.  Orthopedic: Pain on palpation of right ATFL ligament plan pain with plantarflexion inversion of the foot no pain with dorsiflexion eversion of the foot.  Negative anterior drawer talar tilt test noted.  No gross signs of instability noted.  Negative Romberg's test.   Radiographs: None Assessment:   1. Ankle instability, right    Plan:  Patient was evaluated and treated and all  questions answered.  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  Given the instability that she is experiencing I believe she would benefit from [place] or [place] brace to give her stability while ambulating.  She was given [facility] prescription to obtain bracing.  In the future we can discuss surgical options if there is no relief  No follow-ups on file.

## 2024-01-21 ENCOUNTER — Other Ambulatory Visit: Payer: Self-pay

## 2024-01-21 ENCOUNTER — Ambulatory Visit
Admission: RE | Admit: 2024-01-21 | Discharge: 2024-01-21 | Disposition: A | Discharge status: Home / Self Care | Payer: 59 | Source: Ambulatory Visit | Attending: Internal Medicine | Admitting: Internal Medicine

## 2024-01-21 VITALS — BP 133/77 | HR 84 | Temp 98.4°F | Resp 16

## 2024-01-21 DIAGNOSIS — Z87891 Personal history of nicotine dependence: Secondary | ICD-10-CM | POA: Diagnosis not present

## 2024-01-21 DIAGNOSIS — Z993 Dependence on wheelchair: Secondary | ICD-10-CM | POA: Diagnosis not present

## 2024-01-21 DIAGNOSIS — R509 Fever, unspecified: Secondary | ICD-10-CM | POA: Diagnosis present

## 2024-01-21 DIAGNOSIS — J069 Acute upper respiratory infection, unspecified: Secondary | ICD-10-CM | POA: Insufficient documentation

## 2024-01-21 DIAGNOSIS — R059 Cough, unspecified: Secondary | ICD-10-CM | POA: Diagnosis present

## 2024-01-21 DIAGNOSIS — B9789 Other viral agents as the cause of diseases classified elsewhere: Secondary | ICD-10-CM | POA: Insufficient documentation

## 2024-01-21 LAB — POC COVID19/FLU A&B COMBO
Covid Antigen, POC: NEGATIVE
Influenza A Antigen, POC: NEGATIVE
Influenza B Antigen, POC: NEGATIVE

## 2024-01-21 NOTE — ED Provider Notes (Signed)
[facility]    CSN: [id number] Arrival date & time: 01/21/24  1704      History   Chief Complaint Chief Complaint  Patient presents with   Fever    Cough, dizziness - Entered by patient    HPI Katie Barker is a 83 y.o. female.   Patient presents to urgent care for evaluation of cough, congestion, and generalized fatigue that started 2 days ago.  Max temp at home was 102.02 days ago but has responded well to use of Tylenol/Motrin at home.  Cough is mostly dry nonproductive.  Denies shortness of breath, chest pain, heart palpitations, nausea, vomiting, diarrhea, abdominal pain, rash, and recent sick contacts with similar symptoms.  Former smoker approximately 40 years ago, denies recent tobacco product use.  Denies history of chronic respiratory problems, leg swelling, and orthopnea.  She has been taking Tylenol and Motrin for symptoms with some relief.  Currently afebrile.   Fever   Past Medical History:  Diagnosis Date   Allergy    Anemia, iron deficiency 09/07/2012   Labs from April 2013 include: HgB 9.7 MCV 64 RDW 17 (nl) Ferritin 7 Iron 14 % sat 14 TIBC 454 (high) Stool cards May 2013 negative x 3    Anxiety 09/07/2012   Minimal benzo use. About 2 per month. Not on contract.    GERD (gastroesophageal reflux disease) 09/07/2012   Controlled on PPI PRN.    HTN (hypertension) 09/07/2012   On triple drug (single pill) therapy. ARB, HCTZ, and amlodipine. BP well controlled at home. Elevated in MD's office.    Hyperlipemia 09/07/2012   Her 10 yr CV risk is <5%. LDL goal is < 160. Has been on a statin since about 2003.     Osteopenia 03/17/2014   Bone density 01/2014 : dual femur -2.1, R forearm -0.7   Overactive bladder 09/07/2012   Sees urology yearly. Sxs well controlled on Toviaz.    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 Reason for TAH BSO was fibroids    OB History   No obstetric history on file.      Home Medications    Prior to Admission medications   Medication Sig Start Date End Date Taking? Authorizing Provider  Ascorbic Acid (VITAMIN C) 100 MG tablet Take 100 mg by mouth daily.    [provider]  aspirin 81 MG tablet Take 81 mg by mouth daily.    [provider]  benzonatate (TESSALON) 100 MG capsule Take 1 capsule (100 mg total) by mouth every 8 (eight) hours. 08/13/20   Moshe Cipro, FNP  cetirizine (ZYRTEC) 5 MG tablet Take 1 tablet (5 mg total) by mouth daily. 08/13/20   Moshe Cipro, FNP  cholecalciferol (VITAMIN D) 1000 units tablet Take 1,000 Units by mouth  daily.    [provider]  diazepam (VALIUM) 2 MG tablet TAKE ONE TABLET BY MOUTH AS NEEDED FOR ANXIETY 01/26/17   Burns Spain, MD  doxycycline (VIBRAMYCIN) 100 MG capsule Take 1 capsule (100 mg total) by mouth 2 (two) times daily. Patient not taking: Reported on 01/21/2024 07/23/20   Bing Neighbors, NP  EPINEPHrine (EPIPEN 2-PAK) 0.3 mg/0.3 mL IJ SOAJ injection Inject 0.3 mLs (0.3 mg total) into the muscle once. 06/06/16   Baxter Hire, MD  esomeprazole (NEXIUM) 40 MG capsule Take 40 mg by mouth daily at 12 noon.    [provider]  fluconazole (DIFLUCAN) 100 MG tablet Take 100 mg by mouth daily. Patient not taking:  Reported on 01/21/2024 06/11/20   [provider]  fluconazole (DIFLUCAN) 200 MG tablet Take 200 mg by mouth daily. Patient not taking: Reported on 01/21/2024 02/21/20   [provider]  ibuprofen (ADVIL) 800 MG tablet Take 800 mg by mouth every 8 (eight) hours as needed.    [provider]  meclizine (ANTIVERT) 25 MG tablet Take 1 tablet (25 mg total) by mouth 3 (three) times daily as needed for dizziness. 03/11/20   Eustace Moore, MD  MYRBETRIQ 50 MG TB24 tablet Take 50 mg by mouth daily. Patient not taking: Reported on 01/21/2024 07/07/20   [provider]  Olmesartan-Amlodipine-HCTZ 20-5-12.5 MG TABS Take 1 tablet by mouth daily. 02/24/17   [provider]  ondansetron (ZOFRAN) 4 MG tablet Take 1-2 tablets (4-8 mg total) by mouth every 8 (eight) hours as needed for nausea or vomiting. 03/11/20   Eustace Moore, MD  rosuvastatin (CRESTOR) 5 MG tablet Take 5 mg by mouth daily. Patient not taking: Reported on 01/21/2024 05/01/20   [provider]  TOVIAZ 8 MG TB24 tablet Take 8 mg by mouth daily.  Patient not taking: Reported on 01/21/2024 11/25/16   [provider]  traMADol (ULTRAM) 50 MG tablet Take 1 tablet (50 mg total) by mouth every 6 (six) hours as needed. 05/04/23   Alvira Monday, MD  vitamin E 400 UNIT capsule Take 400 Units by mouth daily.    [provider]  dicyclomine (BENTYL) 10 MG capsule Take 1 tab twice daily as needed for abdominal pain and cramping. 01/22/17 03/11/20  Esterwood, Amy S, PA-C  diphenhydrAMINE (SOMINEX) 25 MG tablet Take 25 mg by mouth daily as needed for allergies or sleep. Reported on 06/06/2016  03/11/20  [provider]    Family History Family History  Problem Relation Age of Onset   Asthma Daughter    Allergic rhinitis Daughter    Other Mother    Other Father    Breast cancer Sister    Angioedema Neg Hx    Eczema Neg Hx    Immunodeficiency Neg Hx    Urticaria Neg Hx      Social History Social History   Tobacco Use   Smoking status: Former    Current packs/day: 0.00    Types: Cigarettes    Quit date: 03/08/1975    Years since quitting: 48.9   Smokeless tobacco: Never  Vaping Use   Vaping status: Never Used  Substance Use Topics   Alcohol use: Yes    Alcohol/week: 0.0 standard drinks of alcohol    Comment: socially   Drug use: No     Allergies   Penicillins, Fluticasone, Omeprazole, Ranitidine, Lipitor [atorvastatin], and Pravastatin   Review of Systems Review of Systems  Constitutional:  Positive  for fever.  Per HPI   Physical Exam Triage Vital Signs ED Triage Vitals  Encounter Vitals Group     BP 01/21/24 1837 133/77     Systolic BP Percentile --      Diastolic BP Percentile --      Pulse Rate 01/21/24 1837 84     Resp 01/21/24 1837 16     Temp 01/21/24 1840 98.4 F (36.9 C)     Temp Source 01/21/24 1840 Oral     SpO2 01/21/24 1837 97 %     Weight --      Height --      Head Circumference --      Peak Flow --      Pain Score 01/21/24 1856 0     Pain Loc --      Pain Education --      Exclude from Growth Chart --    No data found.  Updated Vital Signs BP 133/77 (BP Location: Right Arm)   Pulse 84   Temp 98.4 F (36.9 C) (Oral)   Resp 16   LMP 06/15/1987   SpO2 97%   Visual Acuity Right Eye Distance:   Left Eye Distance:   Bilateral Distance:    Right Eye Near:   Left Eye Near:    Bilateral Near:     Physical Exam Vitals and nursing note reviewed.  Constitutional:      Appearance: She is not ill-appearing or toxic-appearing.     Comments: Wheelchair bound at baseline.  HENT:     Head: Normocephalic and atraumatic.     Right Ear: Hearing, tympanic membrane, ear canal and external ear normal.     Left Ear: Hearing, tympanic membrane, ear canal and external ear normal.     Nose: Congestion present.     Mouth/Throat:     Lips: Pink.     Mouth: Mucous membranes are moist. No injury or oral lesions.      Dentition: Normal dentition.     Tongue: No lesions.     Pharynx: Oropharynx is clear. Uvula midline. No pharyngeal swelling, oropharyngeal exudate, posterior oropharyngeal erythema, uvula swelling or postnasal drip.     Tonsils: No tonsillar exudate.  Eyes:     General: Lids are normal. Vision grossly intact. Gaze aligned appropriately.     Extraocular Movements: Extraocular movements intact.     Conjunctiva/sclera: Conjunctivae normal.  Neck:     Trachea: Trachea and phonation normal.  Cardiovascular:     Rate and Rhythm: Normal rate and regular rhythm.     Heart sounds: Normal heart sounds, S1 normal and S2 normal.  Pulmonary:     Effort: Pulmonary effort is normal. No respiratory distress.     Breath sounds: Normal breath sounds and air entry. No wheezing, rhonchi or rales.  Chest:     Chest wall: No tenderness.  Musculoskeletal:     Cervical back: Neck supple.     Right lower leg: No edema.     Left lower leg: No edema.  Lymphadenopathy:     Cervical: No cervical adenopathy.  Skin:    General: Skin is warm and dry.     Capillary Refill: Capillary refill takes less than 2 seconds.     Findings: No rash.  Neurological:     General: No focal deficit present.     Mental Status: She is alert and oriented to person, place, and time. Mental status is at baseline.     Cranial Nerves: No dysarthria  or facial asymmetry.  Psychiatric:        Mood and Affect: Mood normal.        Speech: Speech normal.        Behavior: Behavior normal.        Thought Content: Thought content normal.        Judgment: Judgment normal.      UC Treatments / Results  Labs (all labs ordered are listed, but only abnormal results are displayed) Labs Reviewed  SARS CORONAVIRUS 2 (TAT 6-24 HRS)  POC COVID19/FLU A&B COMBO    EKG   Radiology No results found.  Procedures Procedures (including critical care time)  Medications Ordered in UC Medications - No data to display  Initial Impression  / Assessment and Plan / UC Course  I have reviewed the triage vital signs and the nursing notes.  Pertinent labs & imaging results that were available during my care of the patient were reviewed by me and considered in my medical decision making (see chart for details).   1.  Viral URI with cough Suspect viral URI, viral syndrome.  Strep/viral testing: Point-of-care COVID and flu testing is negative.  PCR COVID testing is pending.  Patient may have antiviral if positive.  Physical exam findings reassuring, vital signs hemodynamically stable, and lungs clear, therefore deferred imaging of the chest.  Advised supportive care/prescriptions for symptomatic relief as outlined in AVS.    Counseled patient on potential for adverse effects with medications prescribed/recommended today, strict ER and return-to-clinic precautions discussed, patient verbalized understanding.    Final Clinical Impressions(s) / UC Diagnoses   Final diagnoses:  Viral URI with cough     Discharge Instructions      You have a viral illness which will improve on its own with rest, fluids, and medications to help with your symptoms.  Tylenol, guaifenesin (plain mucinex), and saline nasal sprays may help relieve symptoms.   Two teaspoons of honey in 1 cup of warm water every 4-6 hours may help with throat pains.  Humidifier in room at nighttime may help soothe cough (clean well daily).   For chest pain, shortness of breath, inability to keep food or fluids down without vomiting, fever that does not respond to tylenol or motrin, or any other severe symptoms, please go to the ER for further evaluation. Return to urgent care as needed, otherwise follow-up with PCP.       ED Prescriptions   None    PDMP not reviewed this encounter.   Carlisle Beers, [place] 01/21/24 1952

## 2024-01-21 NOTE — Discharge Instructions (Signed)

## 2024-01-21 NOTE — ED Triage Notes (Signed)
Reports a fever 2 days ago of 102.  This morning 101.  Facial pain, chest and back soreness and coughing.    Vodka-honey-lemon was tried, seemed to help.    Took ibuprofen last night.

## 2024-01-23 LAB — SARS CORONAVIRUS 2 (TAT 6-24 HRS): SARS Coronavirus 2: NEGATIVE

## 2024-03-08 ENCOUNTER — Emergency Department (HOSPITAL_COMMUNITY)
Admission: EM | Admit: 2024-03-08 | Discharge: 2024-03-08 | Disposition: A | Discharge status: Home / Self Care | Attending: Emergency Medicine | Admitting: Emergency Medicine

## 2024-03-08 ENCOUNTER — Encounter (HOSPITAL_COMMUNITY): Payer: Self-pay

## 2024-03-08 ENCOUNTER — Other Ambulatory Visit: Payer: Self-pay

## 2024-03-08 ENCOUNTER — Emergency Department (HOSPITAL_COMMUNITY)

## 2024-03-08 DIAGNOSIS — Z7982 Long term (current) use of aspirin: Secondary | ICD-10-CM | POA: Diagnosis not present

## 2024-03-08 DIAGNOSIS — R059 Cough, unspecified: Secondary | ICD-10-CM | POA: Diagnosis present

## 2024-03-08 DIAGNOSIS — J189 Pneumonia, unspecified organism: Secondary | ICD-10-CM | POA: Insufficient documentation

## 2024-03-08 DIAGNOSIS — Z79899 Other long term (current) drug therapy: Secondary | ICD-10-CM | POA: Insufficient documentation

## 2024-03-08 LAB — RESP PANEL BY RT-PCR (RSV, FLU A&B, COVID)  RVPGX2
Influenza A by PCR: NEGATIVE
Influenza B by PCR: NEGATIVE
Resp Syncytial Virus by PCR: NEGATIVE
SARS Coronavirus 2 by RT PCR: NEGATIVE

## 2024-03-08 MED ORDER — HYDROCOD POLI-CHLORPHE POLI ER 10-8 MG/5ML PO SUER: 5.0000 mL | Freq: Every evening | ORAL | 0 refills | Status: AC | PRN

## 2024-03-08 MED ORDER — AZITHROMYCIN 250 MG PO TABS: 250.0000 mg | ORAL_TABLET | Freq: Every day | ORAL | 0 refills | Status: AC

## 2024-03-08 NOTE — Discharge Instructions (Signed)
 Your cough may be related to an atypical infection, and so we are putting you on a course of azithromycin.  We are also prescribing you a stronger cough medicine that should only be taken at night as it can cause sleepiness and drowsiness.  Do not drive or operate heavy machinery while on this medication.  Follow-up with your primary care physician.  If you develop new or worsening symptoms then see your primary care physician or return to the ER.

## 2024-03-08 NOTE — ED Triage Notes (Signed)
 Patient is here for evaluation of cough and cold X over a month. Was seen at urgent care for the same, but decided to come here as well. Reports the cough is bothersome to her. Reports productive clear mucous with cough. Reports being prescribed mucinex but has not helped.

## 2024-03-08 NOTE — ED Provider Notes (Signed)
 [place] EMERGENCY DEPARTMENT AT [facility] Provider Note   CSN: [id number] Arrival date & time: 03/08/24  1033     History  Chief Complaint  Patient presents with   Cough    Katie Barker is a 83 y.o. female.  HPI 83 year old female presents with cough.  Cough started at the end of January and has continued.  When it first started she had a fever and went to urgent care and was diagnosed with a viral illness.  She has continued to have a cough though the fever has resolved.  No sore throat, shortness of breath.  The cough is particularly worse at night and first thing in the morning.  It keeps her up at night.  Usually has some clear sputum.  She has been on Mucinex and then later what sounds like Tessalon Perles.  She denies any significant back pain or injury.  She also denies being on blood pressure medicine, states her doctor took her off of these.  Home Medications Prior to Admission medications   Medication Sig Start Date End Date Taking? Authorizing Provider  azithromycin (ZITHROMAX) 250 MG tablet Take 1 tablet (250 mg total) by mouth daily. Take first 2 tablets together, then 1 every day until finished. 03/08/24  Yes Pricilla Loveless, MD  chlorpheniramine-HYDROcodone (TUSSIONEX) 10-8 MG/5ML Take 5 mLs by mouth at bedtime as needed for cough. 03/08/24  Yes Pricilla Loveless, MD  Ascorbic Acid (VITAMIN C) 100 MG tablet Take 100 mg by mouth daily.    [provider]  aspirin 81 MG tablet Take 81 mg by mouth daily.    [provider]  benzonatate (TESSALON) 100 MG capsule Take 1 capsule (100 mg total) by mouth every 8 (eight) hours. 08/13/20   Moshe Cipro, FNP  cetirizine (ZYRTEC) 5 MG tablet Take 1 tablet (5 mg total) by mouth daily. 08/13/20   Moshe Cipro, FNP  cholecalciferol (VITAMIN D) 1000 units tablet Take 1,000 Units by mouth daily.    [provider]  diazepam (VALIUM) 2 MG tablet TAKE ONE TABLET BY MOUTH AS NEEDED  FOR ANXIETY 01/26/17   Burns Spain, MD  doxycycline (VIBRAMYCIN) 100 MG capsule Take 1 capsule (100 mg total) by mouth 2 (two) times daily. Patient not taking: Reported on 01/21/2024 07/23/20   Bing Neighbors, NP  EPINEPHrine (EPIPEN 2-PAK) 0.3 mg/0.3 mL IJ SOAJ injection Inject 0.3 mLs (0.3 mg total) into the muscle once. 06/06/16   Baxter Hire, MD  esomeprazole (NEXIUM) 40 MG capsule Take 40 mg by mouth daily at 12 noon.    [provider]  fluconazole (DIFLUCAN) 100 MG tablet Take 100 mg by mouth daily. Patient not taking: Reported on 01/21/2024 06/11/20   [provider]  fluconazole (DIFLUCAN) 200 MG tablet Take 200 mg by mouth daily. Patient not taking: Reported on 01/21/2024 02/21/20   [provider]  ibuprofen (ADVIL) 800 MG tablet Take 800 mg by mouth every 8 (eight) hours as needed.    [provider]  meclizine (ANTIVERT) 25 MG tablet Take 1 tablet (25 mg total) by mouth 3 (three) times daily as needed for dizziness. 03/11/20   Eustace Moore, MD  MYRBETRIQ 50 MG TB24 tablet Take 50 mg by mouth daily. Patient not taking: Reported on 01/21/2024 07/07/20   [provider]  Olmesartan-Amlodipine-HCTZ 20-5-12.5 MG TABS Take 1 tablet by mouth daily. 02/24/17   [provider]  ondansetron (ZOFRAN) 4 MG tablet Take 1-2 tablets (4-8 mg  total) by mouth every 8 (eight) hours as needed for nausea or vomiting. 03/11/20   Eustace Moore, MD  rosuvastatin (CRESTOR) 5 MG tablet Take 5 mg by mouth daily. Patient not taking: Reported on 01/21/2024 05/01/20   [provider]  TOVIAZ 8 MG TB24 tablet Take 8 mg by mouth daily.  Patient not taking: Reported on 01/21/2024 11/25/16   [provider]  traMADol (ULTRAM) 50 MG tablet Take 1 tablet (50 mg total) by mouth every 6 (six) hours as needed. 05/04/23   Alvira Monday, MD  vitamin E 400 UNIT capsule Take 400 Units by mouth daily.    [provider]  dicyclomine  (BENTYL) 10 MG capsule Take 1 tab twice daily as needed for abdominal pain and cramping. 01/22/17 03/11/20  Esterwood, Amy S, PA-C  diphenhydrAMINE (SOMINEX) 25 MG tablet Take 25 mg by mouth daily as needed for allergies or sleep. Reported on 06/06/2016  03/11/20  [provider]      Allergies    Penicillins, Fluticasone, Omeprazole, Ranitidine, Lipitor [atorvastatin], and Pravastatin    Review of Systems   Review of Systems  Constitutional:  Negative for fever.  HENT:  Negative for sore throat.   Respiratory:  Positive for cough. Negative for shortness of breath.   Musculoskeletal:  Negative for back pain.    Physical Exam Updated Vital Signs BP (!) 166/102 (BP Location: Right Arm)   Pulse 74   Temp 98.2 F (36.8 C) (Oral)   Resp 16   Ht 5\' 1"  (1.549 m)   Wt 62.6 kg   LMP 06/15/1987   SpO2 100%   BMI 26.08 kg/m  Physical Exam Vitals and nursing note reviewed.  Constitutional:      Appearance: She is well-developed.  HENT:     Head: Normocephalic and atraumatic.  Cardiovascular:     Rate and Rhythm: Normal rate and regular rhythm.     Heart sounds: Normal heart sounds.  Pulmonary:     Effort: Pulmonary effort is normal.     Breath sounds: Normal breath sounds. No stridor. No wheezing, rhonchi or rales.  Musculoskeletal:     Thoracic back: No tenderness.     Lumbar back: No tenderness.  Skin:    General: Skin is warm and dry.  Neurological:     Mental Status: She is alert.     ED Results / Procedures / Treatments   Labs (all labs ordered are listed, but only abnormal results are displayed) Labs Reviewed  RESP PANEL BY RT-PCR (RSV, FLU A&B, COVID)  [id number]    EKG None  Radiology DG Chest 2 View Result Date: 03/08/2024 CLINICAL DATA:  Cough. EXAM: CHEST - 2 VIEW COMPARISON:  12/23/2015. FINDINGS: Low lung volume. Bilateral lung fields are clear. Bilateral costophrenic angles are clear. Normal cardio-mediastinal silhouette. There is age-indeterminate mild  anterior wedging deformity of lower thoracic vertebra (most likely T12), which is new since the prior CT scan renal stone protocol from 05/04/2023. No significant retropulsion or spinal canal compromise. Otherwise no acute osseous abnormalities. The soft tissues are within normal limits. IMPRESSION: *No active cardiopulmonary disease. *Age-indeterminate mild anterior wedging deformity of lower thoracic vertebra (most likely T12), which is new since the prior CT scan renal stone protocol from 05/04/2023. No significant retropulsion or spinal canal compromise. Electronically Signed   By: Jules Schick M.D.   On: 03/08/2024 13:11    Procedures Procedures    Medications Ordered in ED Medications - No data to display  ED  Course/ Medical Decision Making/ A&P                                 Medical Decision Making Amount and/or Complexity of Data Reviewed External Data Reviewed: notes.    Details: Urgent care notes Labs:     Details: Negative COVID/flu/RSV Radiology: independent interpretation performed.    Details: No pneumonia  Risk Prescription drug management.   Patient presents with prolonged cough.  Well-appearing here.  No hypoxia, increased work of breathing, etc.  No wheezing or abnormal lung sounds noted.  No leg swelling, chest pain, etc.  X-ray is clear except for an age-indeterminate T12 fracture.  I did let the patient and family know about this T12 fracture though talking to the patient further it sounds like this is not symptomatic and likely not recent.  Due to prolonged cough, will adjust her cough medicine and will give her Tussionex to be used at night, especially when the cough is at its worst.  Also consider that she could have an atypical infection causing prolonged cough and so we will give a course of azithromycin as she has not been on antibiotics during this course.  Will recommend follow-up with PCP and discharge home with return precautions.        Final  Clinical Impression(s) / ED Diagnoses Final diagnoses:  Atypical pneumonia    Rx / DC Orders ED Discharge Orders          Ordered    azithromycin (ZITHROMAX) 250 MG tablet  Daily        03/08/24 1519    chlorpheniramine-HYDROcodone (TUSSIONEX) 10-8 MG/5ML  At bedtime PRN        03/08/24 1519              Pricilla Loveless, MD 03/08/24 1525

## 2024-05-19 ENCOUNTER — Emergency Department (EMERGENCY_DEPARTMENT_HOSPITAL): Admit: 2024-05-19 | Discharge: 2024-05-19 | Disposition: A | Discharge status: Home / Self Care

## 2024-05-19 ENCOUNTER — Other Ambulatory Visit: Payer: Self-pay

## 2024-05-19 ENCOUNTER — Emergency Department (HOSPITAL_COMMUNITY)

## 2024-05-19 ENCOUNTER — Encounter (HOSPITAL_COMMUNITY): Payer: Self-pay

## 2024-05-19 ENCOUNTER — Emergency Department (HOSPITAL_COMMUNITY): Admission: EM | Admit: 2024-05-19 | Discharge: 2024-05-19 | Disposition: A | Discharge status: Home / Self Care

## 2024-05-19 DIAGNOSIS — R600 Localized edema: Secondary | ICD-10-CM | POA: Diagnosis present

## 2024-05-19 DIAGNOSIS — B000 Eczema herpeticum: Secondary | ICD-10-CM | POA: Insufficient documentation

## 2024-05-19 DIAGNOSIS — Z7982 Long term (current) use of aspirin: Secondary | ICD-10-CM | POA: Insufficient documentation

## 2024-05-19 DIAGNOSIS — L309 Dermatitis, unspecified: Secondary | ICD-10-CM

## 2024-05-19 DIAGNOSIS — M7989 Other specified soft tissue disorders: Secondary | ICD-10-CM | POA: Diagnosis not present

## 2024-05-19 LAB — CBC
HCT: 40.4 % (ref 36.0–46.0)
Hemoglobin: 11.8 g/dL — ABNORMAL LOW (ref 12.0–15.0)
MCH: 22.3 pg — ABNORMAL LOW (ref 26.0–34.0)
MCHC: 29.2 g/dL — ABNORMAL LOW (ref 30.0–36.0)
MCV: 76.2 fL — ABNORMAL LOW (ref 80.0–100.0)
Platelets: 343 10*3/uL (ref 150–400)
RBC: 5.3 MIL/uL — ABNORMAL HIGH (ref 3.87–5.11)
RDW: 21.5 % — ABNORMAL HIGH (ref 11.5–15.5)
WBC: 4.9 10*3/uL (ref 4.0–10.5)
nRBC: 0 % (ref 0.0–0.2)

## 2024-05-19 LAB — BASIC METABOLIC PANEL WITH GFR
Anion gap: 8 (ref 5–15)
BUN: 11 mg/dL (ref 8–23)
CO2: 26 mmol/L (ref 22–32)
Calcium: 9.3 mg/dL (ref 8.9–10.3)
Chloride: 103 mmol/L (ref 98–111)
Creatinine, Ser: 0.59 mg/dL (ref 0.44–1.00)
GFR, Estimated: >60 mL/min (ref >60)
Glucose, Bld: 104 mg/dL — ABNORMAL HIGH (ref 70–99)
Potassium: 3.3 mmol/L — ABNORMAL LOW (ref 3.5–5.1)
Sodium: 137 mmol/L (ref 135–145)

## 2024-05-19 MED ORDER — TRIAMCINOLONE ACETONIDE 0.1 % EX CREA: 1.0000 | TOPICAL_CREAM | Freq: Two times a day (BID) | CUTANEOUS | 0 refills | Status: AC

## 2024-05-19 NOTE — ED Provider Notes (Signed)
 [place] EMERGENCY DEPARTMENT AT [facility] Provider Note   CSN: [id number] Arrival date & time: 05/19/24  1055     History  Chief Complaint  Patient presents with   Leg Swelling    Katie Barker is a 83 y.o. female.  83 year old female with past medical history of polio in the past who is wheelchair-bound at baseline presenting to the emergency department today with left leg swelling.  The patient states she is has mild swelling of her right leg as well but seems more predominantly on the left.  She states that this been going now for the past couple of months and has gradually worsened.  The patient denies any chest pain or shortness of breath.  States that she has noticed some dry skin lesions that itch on her left leg as well.  This is started to worsen over the past few days.  She came to the ER today for further evaluation regarding this.        Home Medications Prior to Admission medications   Medication Sig Start Date End Date Taking? Authorizing Provider  triamcinolone  cream (KENALOG ) 0.1 % Apply 1 Application topically 2 (two) times daily. 05/19/24  Yes Carin Charleston, MD  Ascorbic Acid (VITAMIN C) 100 MG tablet Take 100 mg by mouth daily.    [provider]  aspirin 81 MG tablet Take 81 mg by mouth daily.    [provider]  azithromycin  (ZITHROMAX ) 250 MG tablet Take 1 tablet (250 mg total) by mouth daily. Take first 2 tablets together, then 1 every day until finished. 03/08/24   Jerilynn Montenegro, MD  benzonatate  (TESSALON ) 100 MG capsule Take 1 capsule (100 mg total) by mouth every 8 (eight) hours. 08/13/20   Wellington Half, FNP  cetirizine  (ZYRTEC ) 5 MG tablet Take 1 tablet (5 mg total) by mouth daily. 08/13/20   Wellington Half, FNP  chlorpheniramine-HYDROcodone  (TUSSIONEX) 10-8 MG/5ML Take 5 mLs by mouth at bedtime as needed for cough. 03/08/24   Jerilynn Montenegro, MD  cholecalciferol (VITAMIN D ) 1000 units tablet Take 1,000  Units by mouth daily.    [provider]  diazepam  (VALIUM ) 2 MG tablet TAKE ONE TABLET BY MOUTH AS NEEDED FOR ANXIETY 01/26/17   Tere Felts, MD  doxycycline  (VIBRAMYCIN ) 100 MG capsule Take 1 capsule (100 mg total) by mouth 2 (two) times daily. Patient not taking: Reported on 01/21/2024 07/23/20   Buena Carmine, NP  EPINEPHrine  (EPIPEN  2-PAK) 0.3 mg/0.3 mL IJ SOAJ injection Inject 0.3 mLs (0.3 mg total) into the muscle once. 06/06/16   Hicks, Roselyn M, MD  esomeprazole  (NEXIUM ) 40 MG capsule Take 40 mg by mouth daily at 12 noon.    [provider]  fluconazole  (DIFLUCAN ) 100 MG tablet Take 100 mg by mouth daily. Patient not taking: Reported on 01/21/2024 06/11/20   [provider]  fluconazole  (DIFLUCAN ) 200 MG tablet Take 200 mg by mouth daily. Patient not taking: Reported on 01/21/2024 02/21/20   [provider]  ibuprofen  (ADVIL ) 800 MG tablet Take 800 mg by mouth every 8 (eight) hours as needed.    [provider]  meclizine  (ANTIVERT ) 25 MG tablet Take 1 tablet (25 mg total) by mouth 3 (three) times daily as needed for dizziness. 03/11/20   Stephany Ehrich, MD  MYRBETRIQ 50 MG TB24 tablet Take 50 mg by mouth daily. Patient not taking: Reported on 01/21/2024 07/07/20   [provider]  Olmesartan -Amlodipine -HCTZ 20-5-12.5 MG TABS  Take 1 tablet by mouth daily. 02/24/17   [provider]  ondansetron  (ZOFRAN ) 4 MG tablet Take 1-2 tablets (4-8 mg total) by mouth every 8 (eight) hours as needed for nausea or vomiting. 03/11/20   Stephany Ehrich, MD  rosuvastatin (CRESTOR) 5 MG tablet Take 5 mg by mouth daily. Patient not taking: Reported on 01/21/2024 05/01/20   [provider]  TOVIAZ 8 MG TB24 tablet Take 8 mg by mouth daily.  Patient not taking: Reported on 01/21/2024 11/25/16   [provider]  traMADol  (ULTRAM ) 50 MG tablet Take 1 tablet (50 mg total) by mouth every 6 (six) hours as needed. 05/04/23    Scarlette Currier, MD  vitamin E 400 UNIT capsule Take 400 Units by mouth daily.    [provider]  dicyclomine  (BENTYL ) 10 MG capsule Take 1 tab twice daily as needed for abdominal pain and cramping. 01/22/17 03/11/20  Esterwood, Amy S, PA-C  diphenhydrAMINE (SOMINEX) 25 MG tablet Take 25 mg by mouth daily as needed for allergies or sleep. Reported on 06/06/2016  03/11/20  [provider]      Allergies    Penicillins, Fluticasone , Omeprazole , Ranitidine , Lipitor [atorvastatin ], and Pravastatin     Review of Systems   Review of Systems  Cardiovascular:  Positive for leg swelling.  All other systems reviewed and are negative.   Physical Exam Updated Vital Signs BP (!) 163/103   Pulse 83   Temp 97.6 F (36.4 C) (Oral)   Resp 14   LMP 06/15/1987   SpO2 99%  Physical Exam Vitals and nursing note reviewed.   Gen: NAD Eyes: PERRL, EOMI HEENT: no oropharyngeal swelling Neck: trachea midline Resp: clear to auscultation bilaterally Card: RRR, no murmurs, rubs, or gallops Abd: nontender, nondistended Extremities: Trace pitting edema noted to the left lower extremity, there is a scaly patch noted over the anterior shin with some signs of excoriation, no overlying erythema noted, no significant swelling noted to the right lower extremity Vascular: 2+ radial pulses bilaterally, 2+ DP pulses bilaterally Skin: no rashes Psyc: acting appropriately   ED Results / Procedures / Treatments   Labs (all labs ordered are listed, but only abnormal results are displayed) Labs Reviewed  BASIC METABOLIC PANEL WITH GFR - Abnormal; Notable for the following components:      Result Value   Potassium 3.3 (*)    Glucose, Bld 104 (*)    All other components within normal limits  CBC - Abnormal; Notable for the following components:   RBC 5.30 (*)    Hemoglobin 11.8 (*)    MCV 76.2 (*)    MCH 22.3 (*)    MCHC 29.2 (*)    RDW 21.5 (*)    All other components within normal limits     EKG None  Radiology VAS US  LOWER EXTREMITY VENOUS (DVT) (ONLY [facility] & [facility]) Result Date: 05/19/2024  Lower Venous DVT Study Patient Name:  Katie Barker [facility]  Date of Exam:   05/19/2024 Medical Rec #: [id number]           Accession #:    [id number] Date of Birth: 24-Jul-1941           Patient Gender: F Patient Age:   64 years Exam Location:  [facility] Procedure:      VAS US  LOWER EXTREMITY VENOUS (DVT) Referring Phys: Debria Fang Pranavi Aure --------------------------------------------------------------------------------  Indications: Swelling.  Risk Factors: None identified. Comparison Study: No prior studies. Performing Technologist: Lerry Ransom RVT  Examination Guidelines:  A complete evaluation includes B-mode imaging, spectral Doppler, color Doppler, and power Doppler as needed of all accessible portions of each vessel. Bilateral testing is considered an integral part of a complete examination. Limited examinations for reoccurring indications may be performed as noted. The reflux portion of the exam is performed with the patient in reverse Trendelenburg.  +-----+---------------+---------+-----------+----------+--------------+ RIGHTCompressibilityPhasicitySpontaneityPropertiesThrombus Aging +-----+---------------+---------+-----------+----------+--------------+ CFV  Full           Yes      Yes                                 +-----+---------------+---------+-----------+----------+--------------+   +---------+---------------+---------+-----------+----------+--------------+ LEFT     CompressibilityPhasicitySpontaneityPropertiesThrombus Aging +---------+---------------+---------+-----------+----------+--------------+ CFV      Full           Yes      Yes                                 +---------+---------------+---------+-----------+----------+--------------+ SFJ      Full                                                         +---------+---------------+---------+-----------+----------+--------------+ FV Prox  Full                                                        +---------+---------------+---------+-----------+----------+--------------+ FV Mid                  Yes      Yes                                 +---------+---------------+---------+-----------+----------+--------------+ FV Distal               Yes      Yes                                 +---------+---------------+---------+-----------+----------+--------------+ PFV      Full                                                        +---------+---------------+---------+-----------+----------+--------------+ POP      Full           Yes      Yes                                 +---------+---------------+---------+-----------+----------+--------------+ PTV      Full                                                        +---------+---------------+---------+-----------+----------+--------------+  PERO     Full                                                        +---------+---------------+---------+-----------+----------+--------------+     Summary: RIGHT: - No evidence of common femoral vein obstruction.   LEFT: - There is no evidence of deep vein thrombosis in the lower extremity. However, portions of this examination were limited- see technologist comments above.  - No cystic structure found in the popliteal fossa.  *See table(s) above for measurements and observations.    Preliminary    DG Chest Port 1 View Result Date: 05/19/2024 CLINICAL DATA:  Left lower extremity swelling for 2 months. EXAM: PORTABLE CHEST 1 VIEW COMPARISON:  March 08, 2024 FINDINGS: The heart size and mediastinal contours are within normal limits. Both lungs are clear. The visualized skeletal structures are unremarkable. IMPRESSION: No active disease. Electronically Signed   By: Rosalene Colon M.D.   On: 05/19/2024 14:40    Procedures Procedures     Medications Ordered in ED Medications - No data to display  ED Course/ Medical Decision Making/ A&P                                 Medical Decision Making 83 year old female with past medical history of polio who is wheelchair-bound at baseline as well as hypertension hyperlipidemia presenting to the emergency department today with left leg swelling for the past 2 months.  Will further evaluate her here with an ultrasound to evaluate for DVT.  Also obtain basic labs and a chest x-ray to evaluate for CHF or renal dysfunction.  This may be due to her chronic immobility here as well.  Her rash that she is concerned about does look consistent with eczema.  Will treat her with topical steroids upon discharge.  If her workup is reassuring she be discharged with return precautions.  She denies any symptoms concerning for pulmonary embolism at this time.  The patient's ultrasounds are negative.  Chest x-ray is clear.  Renal function is within normal limits.  She is discharged with return precautions.  Amount and/or Complexity of Data Reviewed Labs: ordered. Radiology: ordered.  Risk Prescription drug management.           Final Clinical Impression(s) / ED Diagnoses Final diagnoses:  Peripheral edema  Eczema, unspecified type    Rx / DC Orders ED Discharge Orders          Ordered    triamcinolone cream (KENALOG) 0.1 %  2 times daily        05/19/24 1539              Carin Charleston, MD 05/19/24 1540

## 2024-05-19 NOTE — Discharge Instructions (Signed)
 Please try the triamcinolone cream and follow-up with your doctor.  Use the compression stockings.  Your kidney function look normal and there were no signs of heart failure.  Please follow-up with your doctor and return to the ER for worsening symptoms.

## 2024-05-19 NOTE — Progress Notes (Signed)
 Left lower extremity venous duplex has been completed. Preliminary results can be found in CV Proc through chart review.  Results were given to Dr. Charlee Conine.  05/19/24 3:20 PM Birda Buffy RVT

## 2024-05-19 NOTE — ED Triage Notes (Signed)
 Left leg swelling for the past two months. States it does not happen all the time. States she she elevates her legs the swelling goes away. Denies any chest pain or sob.
